# Patient Record
Sex: Male | Born: 1965 | Race: White | Hispanic: No | Smoking: Current every day smoker
Health system: Southern US, Community
[De-identification: ages and names within clinical notes are randomized; demographics above are authoritative.]

## PROBLEM LIST (undated history)

## (undated) DIAGNOSIS — T884XXA Failed or difficult intubation, initial encounter: Secondary | ICD-10-CM

## (undated) DIAGNOSIS — Z789 Other specified health status: Secondary | ICD-10-CM

## (undated) HISTORY — PX: VASECTOMY: SHX75

## (undated) HISTORY — PX: LEG SURGERY: SHX1003

## (undated) HISTORY — PX: HERNIA REPAIR: SHX51

## (undated) SURGERY — REPAIR, HERNIA, INGUINAL, LAPAROSCOPIC
Anesthesia: Choice | Laterality: Left

---

## 1998-09-27 HISTORY — PX: VASECTOMY: SHX75

## 2003-09-28 HISTORY — PX: INGUINAL HERNIA REPAIR: SUR1180

## 2005-04-05 ENCOUNTER — Ambulatory Visit: Payer: Self-pay | Admitting: Surgery

## 2009-05-14 ENCOUNTER — Ambulatory Visit: Payer: Self-pay | Admitting: Unknown Physician Specialty

## 2011-06-04 ENCOUNTER — Inpatient Hospital Stay: Payer: Self-pay | Admitting: Orthopedic Surgery

## 2011-06-04 DIAGNOSIS — I10 Essential (primary) hypertension: Secondary | ICD-10-CM

## 2013-01-17 IMAGING — CR DG TIBIA/FIBULA 2V*L*
1 series · 3 of 3 positions shown · non-contrast
Comparison: none

REASON FOR EXAM: fall injury
COMMENTS:

[Series 1: view not recorded · 0.17mm/px · 3 of 3 slices shown]
[im 1/3]
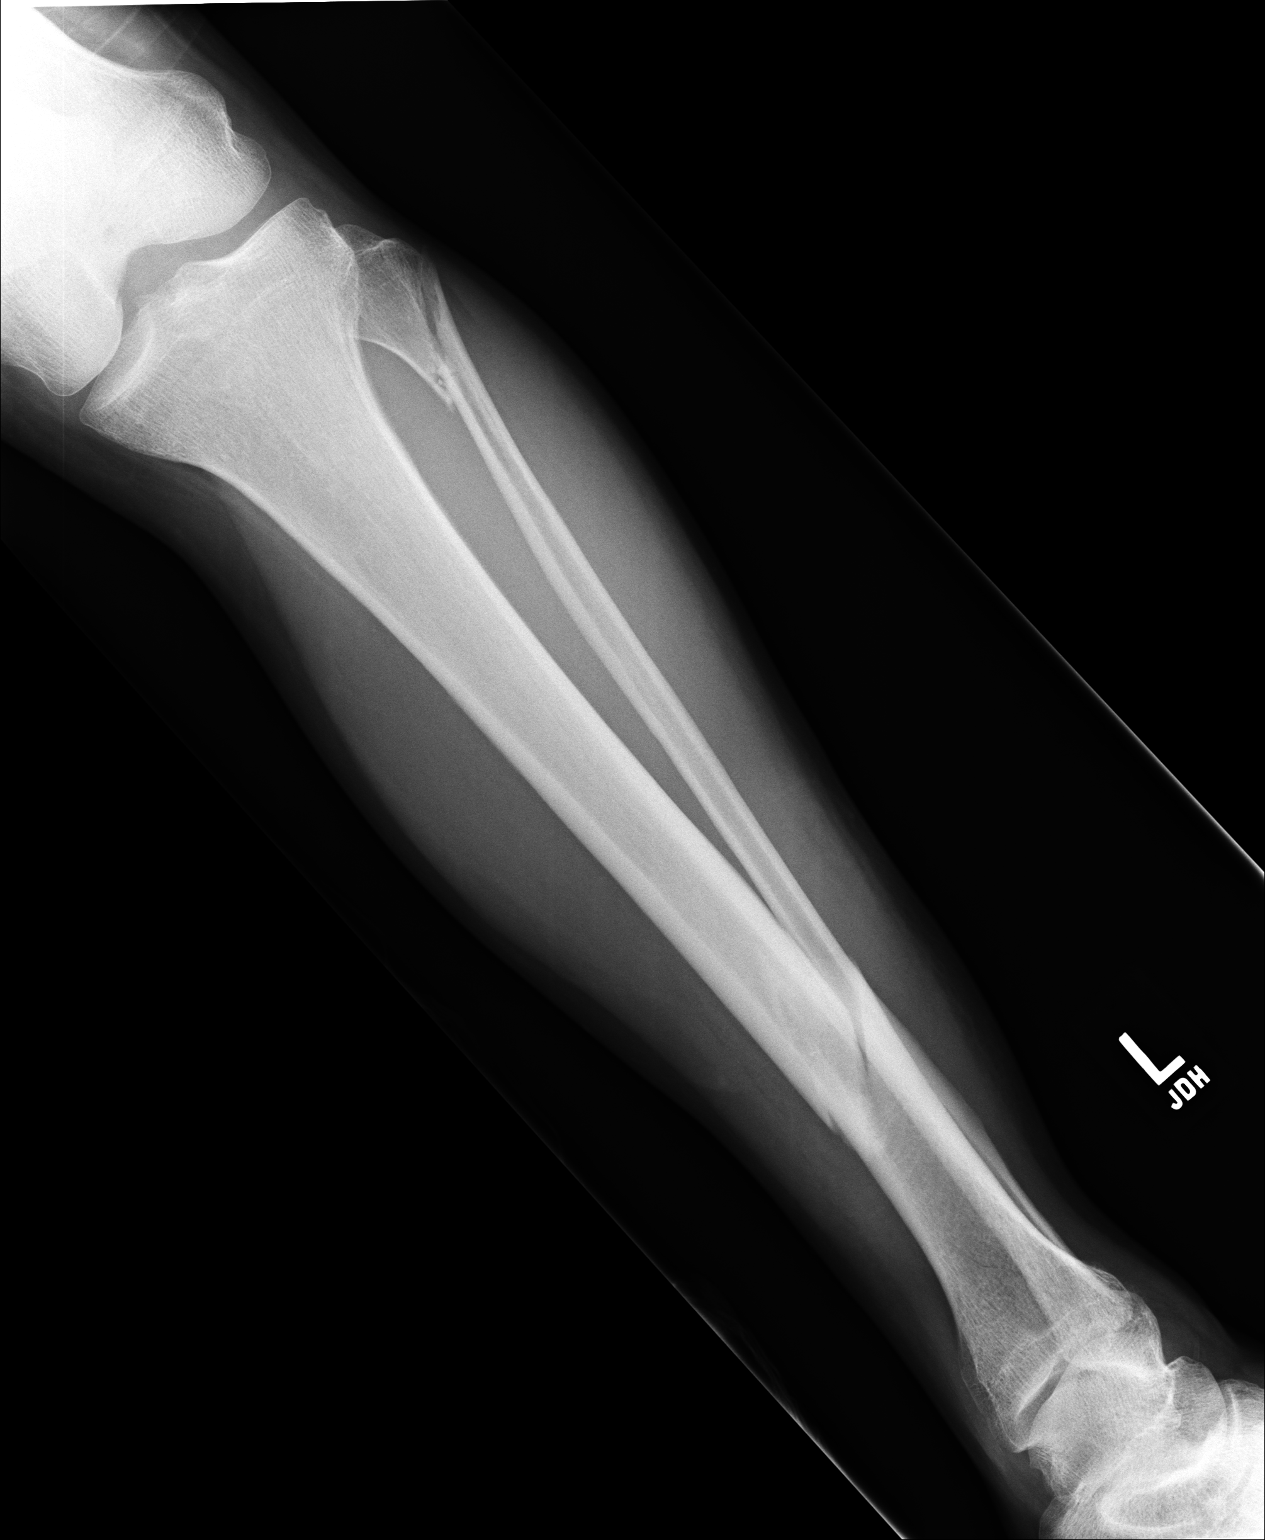
[im 2/3]
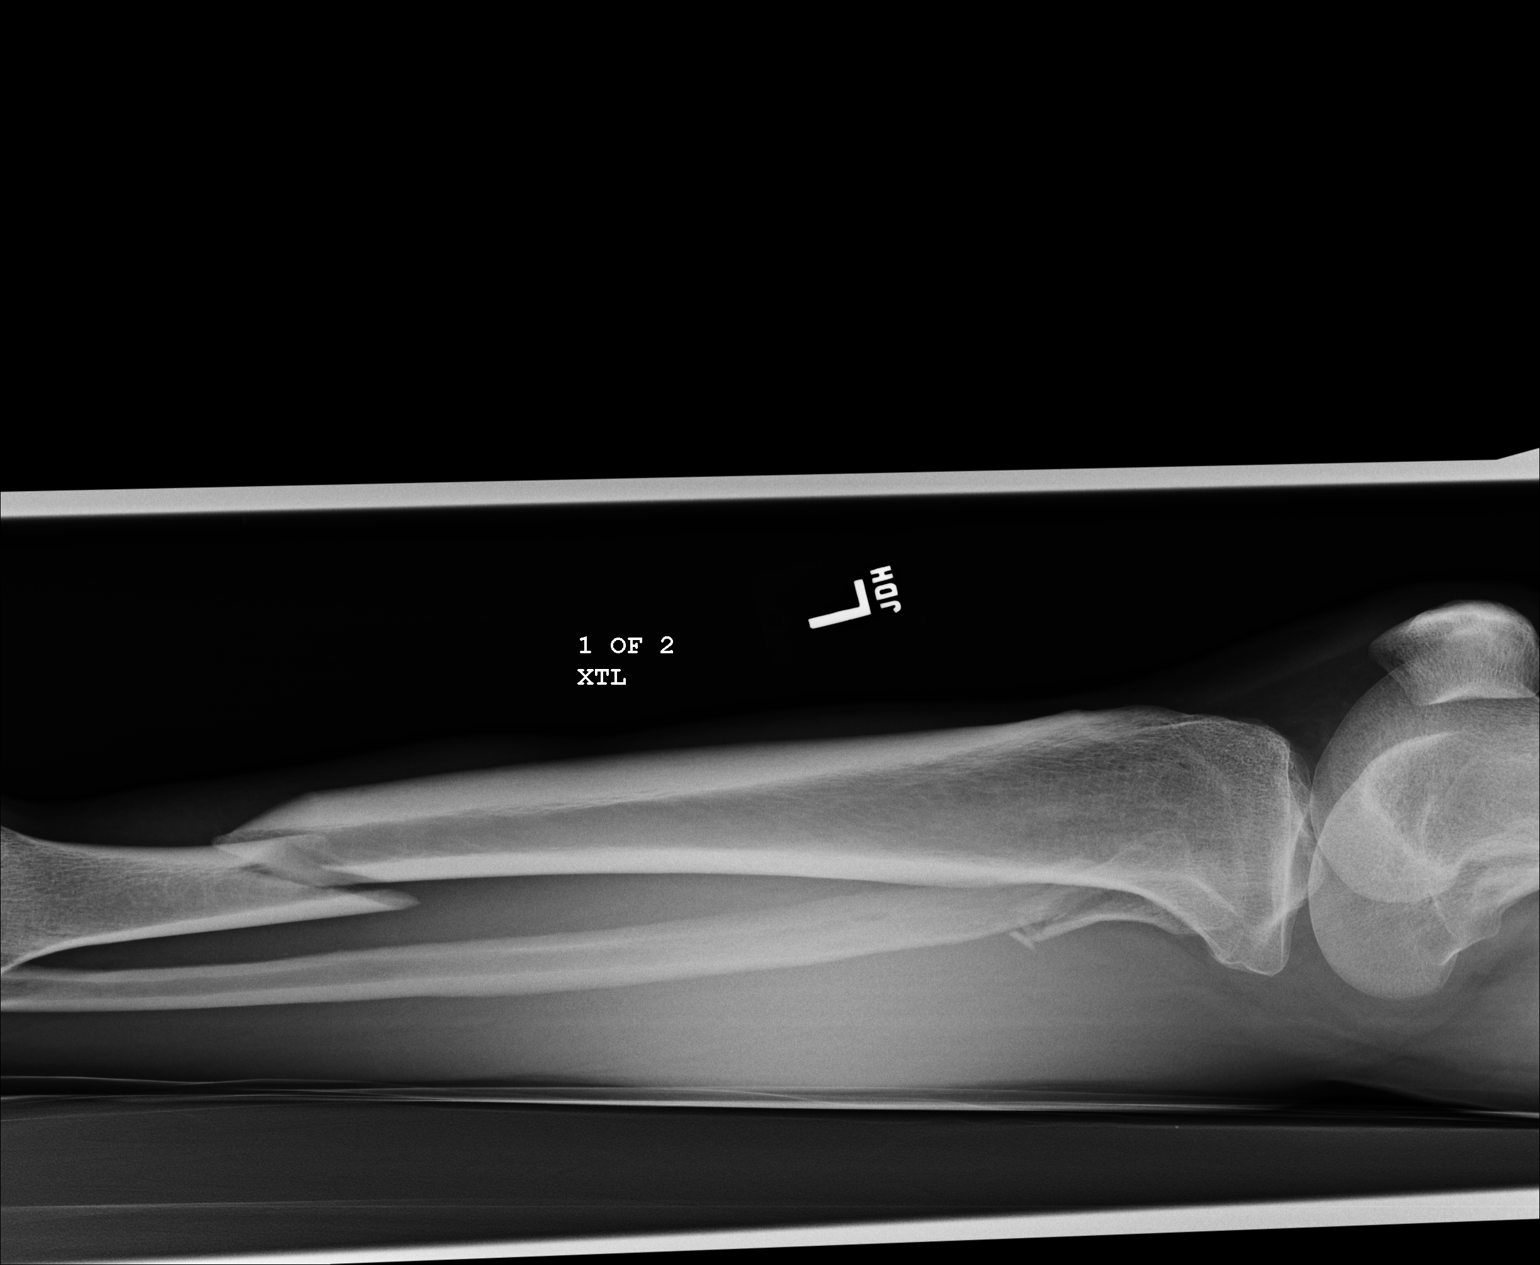
[im 3/3]
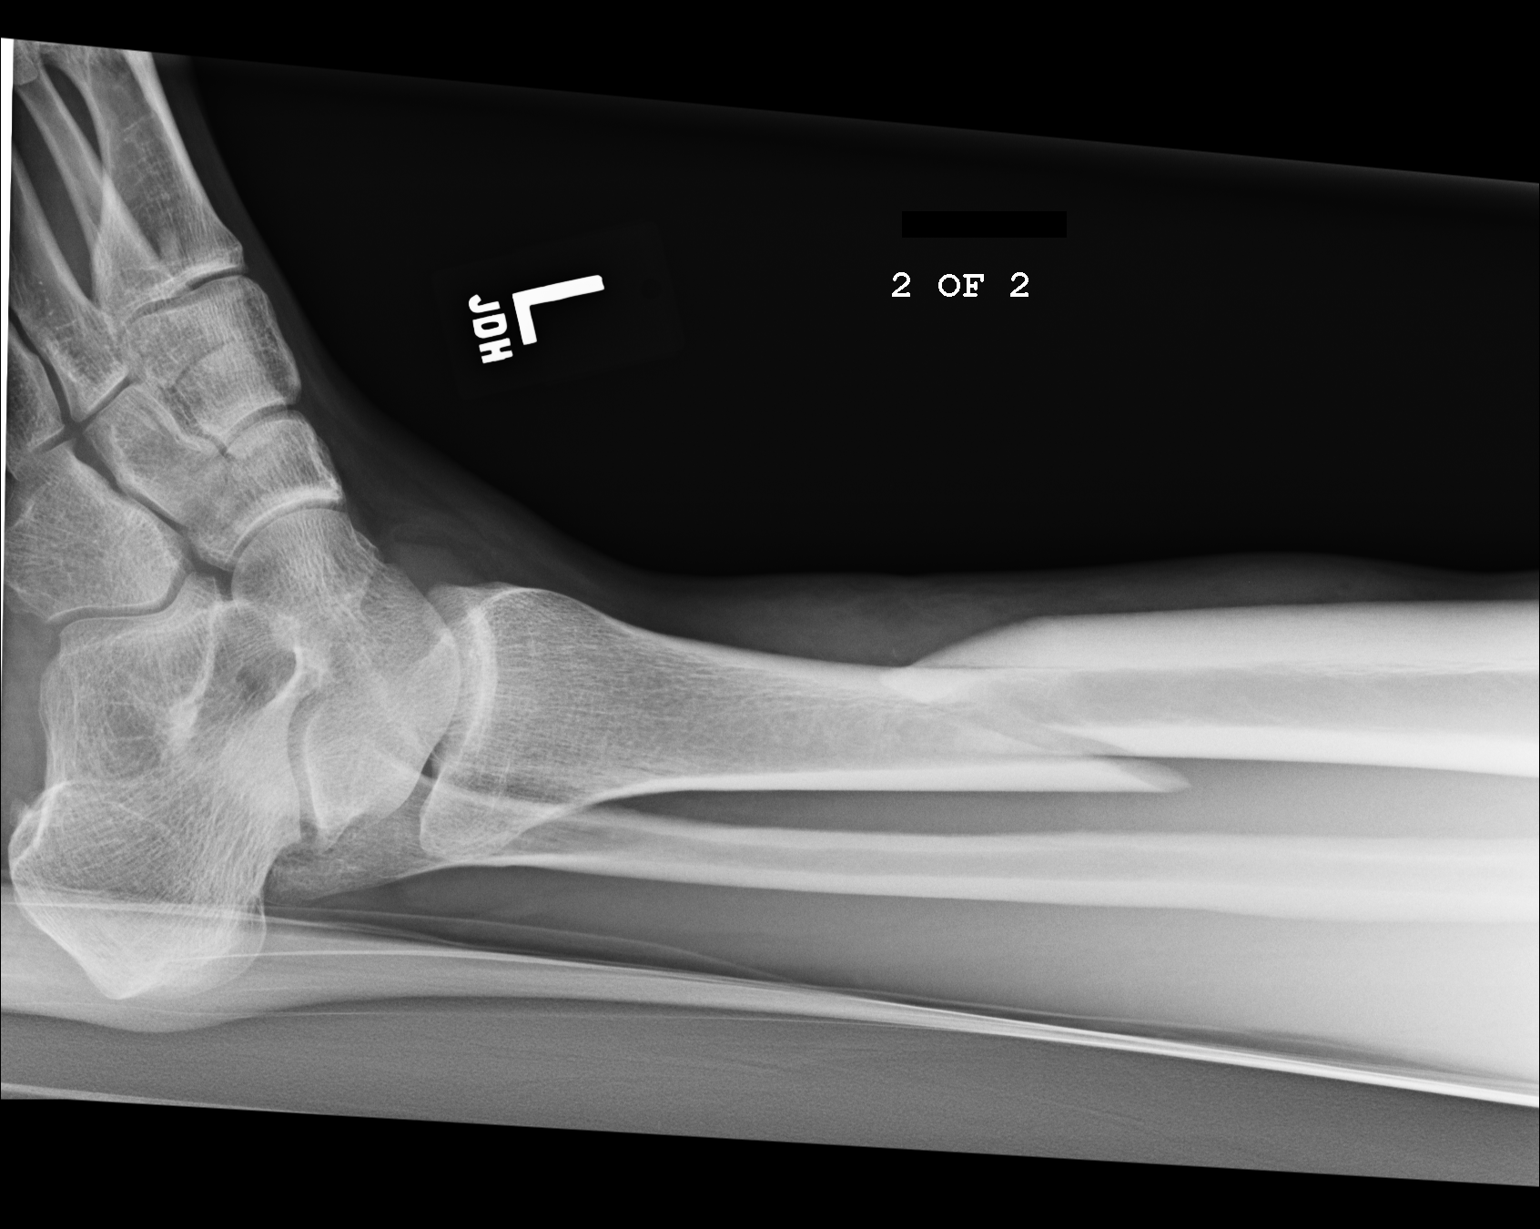

[3 of 3 positions shown; findings below may reference images not displayed]

PROCEDURE:     DXR - DXR TIBIA AND FIBULA LT (LOWER L  - June 04, 2011  [DATE]

RESULT:

A distal oblique tibial fracture is identified demonstrating posterior
displacement as well as posterior medial angulation. There is approximately
4 to 5 mm of distraction and 4 to 5 mm of override. An oblique fracture is
also identified along the proximal fibula with approximately 0.5 cm of
override, 3 to 4 cm of distraction and medial angulation.
IMPRESSION: Tibial and fibula fractures as described above.

## 2016-12-18 ENCOUNTER — Inpatient Hospital Stay
Admission: AD | Admit: 2016-12-18 | Discharge: 2016-12-20 | DRG: 885 | Disposition: A | Discharge status: Home / Self Care | Payer: No Typology Code available for payment source | Source: Ambulatory Visit | Attending: Psychiatry | Admitting: Psychiatry

## 2016-12-18 ENCOUNTER — Emergency Department
Admission: EM | Admit: 2016-12-18 | Discharge: 2016-12-18 | Disposition: A | Discharge status: Other Acute Inpatient Hospital | Payer: Self-pay | Attending: Emergency Medicine | Admitting: Emergency Medicine

## 2016-12-18 ENCOUNTER — Encounter: Payer: Self-pay | Admitting: Emergency Medicine

## 2016-12-18 DIAGNOSIS — F10220 Alcohol dependence with intoxication, uncomplicated: Secondary | ICD-10-CM | POA: Diagnosis present

## 2016-12-18 DIAGNOSIS — R45851 Suicidal ideations: Secondary | ICD-10-CM | POA: Diagnosis present

## 2016-12-18 DIAGNOSIS — F332 Major depressive disorder, recurrent severe without psychotic features: Principal | ICD-10-CM | POA: Diagnosis present

## 2016-12-18 DIAGNOSIS — F10920 Alcohol use, unspecified with intoxication, uncomplicated: Secondary | ICD-10-CM

## 2016-12-18 DIAGNOSIS — G4700 Insomnia, unspecified: Secondary | ICD-10-CM | POA: Diagnosis present

## 2016-12-18 DIAGNOSIS — F1010 Alcohol abuse, uncomplicated: Secondary | ICD-10-CM | POA: Diagnosis not present

## 2016-12-18 DIAGNOSIS — F17200 Nicotine dependence, unspecified, uncomplicated: Secondary | ICD-10-CM | POA: Insufficient documentation

## 2016-12-18 DIAGNOSIS — F10120 Alcohol abuse with intoxication, uncomplicated: Secondary | ICD-10-CM | POA: Insufficient documentation

## 2016-12-18 DIAGNOSIS — F1020 Alcohol dependence, uncomplicated: Secondary | ICD-10-CM | POA: Diagnosis present

## 2016-12-18 LAB — URINALYSIS, COMPLETE (UACMP) WITH MICROSCOPIC
BACTERIA UA: NONE SEEN
Bilirubin Urine: NEGATIVE
Glucose, UA: NEGATIVE mg/dL
Hgb urine dipstick: NEGATIVE
Ketones, ur: NEGATIVE mg/dL
LEUKOCYTES UA: NEGATIVE
NITRITE: NEGATIVE
PH: 7 (ref 5.0–8.0)
Protein, ur: NEGATIVE mg/dL
SPECIFIC GRAVITY, URINE: 1.021 (ref 1.005–1.030)
SQUAMOUS EPITHELIAL / LPF: NONE SEEN

## 2016-12-18 LAB — COMPREHENSIVE METABOLIC PANEL
ALBUMIN: 4.7 g/dL (ref 3.5–5.0)
ALT: 16 U/L — ABNORMAL LOW (ref 17–63)
AST: 24 U/L (ref 15–41)
Alkaline Phosphatase: 62 U/L (ref 38–126)
Anion gap: 7 (ref 5–15)
BUN: 12 mg/dL (ref 6–20)
CHLORIDE: 105 mmol/L (ref 101–111)
CO2: 28 mmol/L (ref 22–32)
CREATININE: 0.86 mg/dL (ref 0.61–1.24)
Calcium: 9.5 mg/dL (ref 8.9–10.3)
GFR calc non Af Amer: >60 mL/min (ref >60)
GLUCOSE: 109 mg/dL — AB (ref 65–99)
Potassium: 4.1 mmol/L (ref 3.5–5.1)
SODIUM: 140 mmol/L (ref 135–145)
Total Bilirubin: 0.6 mg/dL (ref 0.3–1.2)
Total Protein: 8.5 g/dL — ABNORMAL HIGH (ref 6.5–8.1)

## 2016-12-18 LAB — CBC WITH DIFFERENTIAL/PLATELET
Basophils Absolute: 0.1 10*3/uL (ref 0–0.1)
Basophils Relative: 1 %
Eosinophils Absolute: 0.1 10*3/uL (ref 0–0.7)
Eosinophils Relative: 2 %
HCT: 47.8 % (ref 40.0–52.0)
HEMOGLOBIN: 16.1 g/dL (ref 13.0–18.0)
Lymphocytes Relative: 32 %
Lymphs Abs: 2.4 10*3/uL (ref 1.0–3.6)
MCH: 28.9 pg (ref 26.0–34.0)
MCHC: 33.7 g/dL (ref 32.0–36.0)
MCV: 85.8 fL (ref 80.0–100.0)
MONOS PCT: 12 %
Monocytes Absolute: 0.9 10*3/uL (ref 0.2–1.0)
Neutro Abs: 4 10*3/uL (ref 1.4–6.5)
Neutrophils Relative %: 53 %
Platelets: 347 10*3/uL (ref 150–440)
RBC: 5.57 MIL/uL (ref 4.40–5.90)
RDW: 13.7 % (ref 11.5–14.5)
WBC: 7.5 10*3/uL (ref 3.8–10.6)

## 2016-12-18 LAB — URINE DRUG SCREEN, QUALITATIVE (ARMC ONLY)
Amphetamines, Ur Screen: NOT DETECTED
BARBITURATES, UR SCREEN: NOT DETECTED
Benzodiazepine, Ur Scrn: NOT DETECTED
COCAINE METABOLITE, UR ~~LOC~~: NOT DETECTED
Cannabinoid 50 Ng, Ur ~~LOC~~: NOT DETECTED
MDMA (ECSTASY) UR SCREEN: NOT DETECTED
METHADONE SCREEN, URINE: NOT DETECTED
OPIATE, UR SCREEN: NOT DETECTED
Phencyclidine (PCP) Ur S: NOT DETECTED
TRICYCLIC, UR SCREEN: NOT DETECTED

## 2016-12-18 LAB — SALICYLATE LEVEL

## 2016-12-18 LAB — ETHANOL: Alcohol, Ethyl (B): 99 mg/dL — ABNORMAL HIGH (ref <5)

## 2016-12-18 LAB — ACETAMINOPHEN LEVEL

## 2016-12-18 MED ORDER — ACETAMINOPHEN 325 MG PO TABS: 650.0000 mg | ORAL_TABLET | Freq: Four times a day (QID) | ORAL | Status: DC | PRN

## 2016-12-18 MED ORDER — ALUM & MAG HYDROXIDE-SIMETH 200-200-20 MG/5ML PO SUSP: 30.0000 mL | ORAL | Status: DC | PRN

## 2016-12-18 MED ORDER — TRAZODONE HCL 50 MG PO TABS: 50.0000 mg | ORAL_TABLET | Freq: Every evening | ORAL | Status: DC | PRN

## 2016-12-18 MED ORDER — HYDROXYZINE HCL 25 MG PO TABS: 25.0000 mg | ORAL_TABLET | Freq: Three times a day (TID) | ORAL | Status: DC | PRN

## 2016-12-18 MED ORDER — MAGNESIUM HYDROXIDE 400 MG/5ML PO SUSP: 30.0000 mL | Freq: Every day | ORAL | Status: DC | PRN

## 2016-12-18 NOTE — ED Provider Notes (Signed)
[facility] Emergency Department Provider Note   ____________________________________________   First MD Initiated Contact with Patient 12/18/16 [phone]     (approximate)  I have reviewed the triage vital signs and the nursing notes.   HISTORY  Chief Complaint Medical Clearance    HPI Corey Boyd is a 51 y.o. male who comes into the hospital today with suicidal ideation. He reports that him and his wife had an argument and the wife stated that he threatened to shoot himself. The patient was drinking and states he probably may have said something to get a response. He denies any suicidal ideation. He denies any previous suicidal ideation although the IVC paperwork states that he has done this in the past. The patient states he had a bottle of wine another couple of glasses. He denies any drugs and denies that he has ever been hospitalized in a psychiatric facility. The patient has no complaints at this time. The patient denies any feelings of depression or any symptoms of hallucinations.   History reviewed. No pertinent past medical history.  There are no active problems to display for this patient.   Past Surgical History:  Procedure Laterality Date  . HERNIA REPAIR    . LEG SURGERY    . VASECTOMY      Prior to Admission medications   Not on File    Allergies Patient has no known allergies.  History reviewed. No pertinent family history.  Social History Social History  Substance Use Topics  . Smoking status: Current Every Day Smoker    Packs/day: 1.00  . Smokeless tobacco: Never Used  . Alcohol use Yes     Comment: occasionally    Review of Systems Constitutional: No fever/chills Eyes: No visual changes. ENT: No sore throat. Cardiovascular: Denies chest pain. Respiratory: Denies shortness of breath. Gastrointestinal: No abdominal pain.  No nausea, no vomiting.  No diarrhea.  No constipation. Genitourinary: Negative for  dysuria. Musculoskeletal: Negative for back pain. Skin: Negative for rash. Neurological: Negative for headaches, focal weakness or numbness. Psych: Suicidal gestures  10-point ROS otherwise negative.  ____________________________________________   PHYSICAL EXAM:  VITAL SIGNS: ED Triage Vitals  Enc Vitals Group     BP 12/18/16 0111 (!) 156/92     Pulse Rate 12/18/16 0111 (!) 118     Resp 12/18/16 0111 18     Temp 12/18/16 0111 98.4 F (36.9 C)     Temp Source 12/18/16 0111 Oral     SpO2 12/18/16 0111 99 %     Weight 12/18/16 0111 190 lb (86.2 kg)     Height 12/18/16 0111 6' (1.829 m)     Head Circumference --      Peak Flow --      Pain Score 12/18/16 0112 0     Pain Loc --      Pain Edu? --      Excl. in GC? --     Constitutional: Alert and oriented. Well appearing and in no acute distress. Eyes: Conjunctivae are normal. PERRL. EOMI. Head: Atraumatic. Nose: No congestion/rhinnorhea. Mouth/Throat: Mucous membranes are moist.  Oropharynx non-erythematous. Cardiovascular: Normal rate, regular rhythm. Grossly normal heart sounds.  Good peripheral circulation. Respiratory: Normal respiratory effort.  No retractions. Lungs CTAB. Gastrointestinal: Soft and nontender. No distention. Positive bowel sounds Musculoskeletal: No lower extremity tenderness nor edema.  Neurologic:  Normal speech and language. none Skin:  Skin is warm, dry and intact.  Psychiatric: Mood and affect are normal.  ____________________________________________   LABS (all labs ordered are listed, but only abnormal results are displayed)  Labs Reviewed  COMPREHENSIVE METABOLIC PANEL - Abnormal; Notable for the following:       Result Value   Glucose, Bld 109 (*)    Total Protein 8.5 (*)    ALT 16 (*)    All other components within normal limits  ETHANOL - Abnormal; Notable for the following:    Alcohol, Ethyl (B) 99 (*)    All other components within normal limits  ACETAMINOPHEN LEVEL -  Abnormal; Notable for the following:    Acetaminophen (Tylenol), Serum <10 (*)    All other components within normal limits  CBC WITH DIFFERENTIAL/PLATELET  SALICYLATE LEVEL  URINE DRUG SCREEN, QUALITATIVE ([facility] ONLY)   ____________________________________________  EKG  none ____________________________________________  RADIOLOGY  none ____________________________________________   PROCEDURES  Procedure(s) performed: None  Procedures  Critical Care performed: No  ____________________________________________   INITIAL IMPRESSION / ASSESSMENT AND PLAN / ED COURSE  Pertinent labs & imaging results that were available during my care of the patient were reviewed by me and considered in my medical decision making (see chart for details).  This is a 51 year old male who comes into the hospital today with suicidal gestures. The patient denies any suicidal ideation but according to the IVC paperwork he was holding a gun to his chin and threatening to shoot himself. I will have the patient seen by TTS as well as tele-psychiatry. The patient will be seen by psych.      ____________________________________________   FINAL CLINICAL IMPRESSION(S) / ED DIAGNOSES  Final diagnoses:  Suicidal ideation  Alcoholic intoxication without complication (HCC)      NEW MEDICATIONS STARTED DURING THIS VISIT:  New Prescriptions   No medications on file     Note:  This document was prepared using Dragon voice recognition software and may include unintentional dictation errors.    Rebecka ApleyAllison P Svea Pusch, MD 12/18/16 [phone]

## 2016-12-18 NOTE — ED Notes (Signed)
[facility] Dr. Toni Amendlapacs.  Attending Physician will be Dr. Jennet MaduroPucilowska.   Patient [facility] Gwen.   Intake Paper Work has been signed and placed on patient chart.  ER staff is aware of the admission ( ER Sect.; Dr. Alphonzo LemmingsMcShane, ER MD; Clydie BraunKaren Patient's Nurse & Nedra HaiLee Patient Access).
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
## 2016-12-18 NOTE — ED Notes (Signed)
Pt informed will be staying and not going home today. Pt unhappy about this but remains calm and cooperative at this time. Wants a phone call; informed him will bring phone at 100 pm during phone hours. Verbalized understanding.

## 2016-12-18 NOTE — ED Notes (Signed)
Waiting on disposition

## 2016-12-18 NOTE — ED Notes (Signed)
SOC has recommended inpatient admission

## 2016-12-18 NOTE — ED Notes (Signed)
Referral information for Placement has been faxed to;    [facility]  [phone])   [facility] ([phone]),    [facility] [phone]),    [facility] [phone]),

## 2016-12-18 NOTE — ED Notes (Signed)
Refused food tray. 

## 2016-12-18 NOTE — ED Triage Notes (Signed)
[facility] [place] [facility] [place]. Pt states he has been drinking tonight and got into an argument with his wife and "said some stupid things". Officer reports IVC papers are on the way.
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
## 2016-12-18 NOTE — ED Notes (Signed)
Pt being reviewed for possible admission to [facility]. H&P and Assessment have been faxed to the BH Unit for the charge nurse to review and provide bed assignment.      Nicole Yul Diana, MS, NCC, LPCA Therapeutic Triage Specialist  

## 2016-12-18 NOTE — ED Notes (Signed)
SOC taken to room att

## 2016-12-18 NOTE — ED Notes (Signed)
[facility] Will be evaluating pt shortly.
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
## 2016-12-18 NOTE — ED Notes (Signed)
Pt oriented to BHU 5. He denies SI and says his actions were "stupid" and he regrets them. He is calm and cooperative but says he wishes he could go home. He denies pain. He says he does not drink daily. Regarding the night in question, he admits drinking a bottle of wine plus two more glasses. He has made a phone call and used the TV remote. Will continue to monitor for needs/safety.

## 2016-12-18 NOTE — Progress Notes (Signed)
Pt was transferred from BHU to BMU, stable, AOx4, and in no acute distress. Belongings and paperwork were handed to pt's RN.

## 2016-12-18 NOTE — BH Assessment (Signed)
Assessment Note  Corey Boyd is an 51 y.o. male presenting to the ED for suicidal ideations and alcohol intoxication.  According to the IVC, pt placed a gun to his chin and said he was going to blow his brains out.  Patient made these threat sin front of his girlfriend and grandchildren.    Patient confirms the allegations but denies intent.  He reports that he and girlfriend were arguing and stated that he made the suicidal gesture out anger.  He reports he and girlfriend were drinking last night and got into a heated argument.  He states that they have a "love/hate relationship" with each other.  He states that he made a "stupid mistake" in making the suicidal gesture.  Pt currently denies SI/HI.  He denies any drug/alcohol use, although he did state that he drinks a couple off beer one occasions.  He denies any auditory/visual hallucinations.  Diagnosis: Suicidal secondary to alcohol intoxication  Past Medical History: History reviewed. No pertinent past medical history.  Past Surgical History:  Procedure Laterality Date  . HERNIA REPAIR    . LEG SURGERY    . VASECTOMY      Family History: History reviewed. No pertinent family history.  Social History:  reports that he has been smoking.  He has been smoking about 1.00 pack per day. He has never used smokeless tobacco. He reports that he drinks alcohol. His drug history is not on file.  Additional Social History:  Alcohol / Drug Use Pain Medications: See PTA Prescriptions: See PTA Over the Counter: See PTA History of alcohol / drug use?: No history of alcohol / drug abuse (Pt denies history of drug/alcohol use)  CIWA: CIWA-Ar BP: 122/84 Pulse Rate: 95 COWS:    Allergies: No Known Allergies  Home Medications:  (Not in a hospital admission)  OB/GYN Status:  No LMP for male patient.  General Assessment Data Location of Assessment: [facility] ED TTS Assessment: In system Is this a Tele or Face-to-Face Assessment?:  Face-to-Face Is this an Initial Assessment or a Re-assessment for this encounter?: Initial Assessment Marital status: Married [place] name: n/a Is patient pregnant?: No Pregnancy Status: No Living Arrangements: Spouse/significant other Can pt return to current living arrangement?: Yes Admission Status: Involuntary Is patient capable of signing voluntary admission?: Yes Referral Source: Self/Family/Friend Insurance type: None     Crisis Care Plan Living Arrangements: Spouse/significant other Legal Guardian: Other: (self) Name of Psychiatrist: None reported Name of Therapist: none reported  Education Status Is patient currently in school?: No Current Grade: na Highest grade of school patient has completed: na Name of school: na Contact person: na  Risk to self with the past 6 months Suicidal Ideation: Yes-Currently Present Has patient been a risk to self within the past 6 months prior to admission? : No Suicidal Intent: No Has patient had any suicidal intent within the past 6 months prior to admission? : No Is patient at risk for suicide?: No Suicidal Plan?: No Has patient had any suicidal plan within the past 6 months prior to admission? : No Access to Means: Yes Specify Access to Suicidal Means: Pt has access to a gun What has been your use of drugs/alcohol within the last 12 months?: Pt reports drinking several beers last night Previous Attempts/Gestures: No How many times?: 0 Other Self Harm Risks: None identified Triggers for Past Attempts: None known Intentional Self Injurious Behavior: None Family Suicide History: No Recent stressful life event(s): Conflict (Comment) (conflict with wife) Persecutory voices/beliefs?:  No Depression: Yes Depression Symptoms: Loss of interest in usual pleasures Substance abuse history and/or treatment for substance abuse?: No Suicide prevention information given to non-admitted patients: Not applicable  Risk to Others within the  past 6 months Homicidal Ideation: No Does patient have any lifetime risk of violence toward others beyond the six months prior to admission? : No Thoughts of Harm to Others: No Current Homicidal Intent: No Current Homicidal Plan: No Access to Homicidal Means: No Identified Victim: None identified History of harm to others?: No Assessment of Violence: None Noted Violent Behavior Description: None identified Does patient have access to weapons?: Yes (Comment) Criminal Charges Pending?: No Does patient have a court date: No Is patient on probation?: No  Psychosis Hallucinations: None noted Delusions: None noted  Mental Status Report Appearance/Hygiene: In scrubs Eye Contact: Good Motor Activity: Freedom of movement Speech: Logical/coherent Level of Consciousness: Drowsy Mood: Ashamed/humiliated, Pleasant Affect: Appropriate to circumstance Anxiety Level: Minimal Thought Processes: Relevant Judgement: Impaired Orientation: Person, Place, Time, Situation Obsessive Compulsive Thoughts/Behaviors: Minimal  Cognitive Functioning Concentration: Normal Memory: Recent Intact, Remote Intact IQ: Average Insight: Poor Impulse Control: Poor Appetite: Fair Weight Loss: 0 Weight Gain: 0 Sleep: No Change Vegetative Symptoms: None  ADLScreening [facility] Assessment Services) Patient's cognitive ability adequate to safely complete daily activities?: Yes Patient able to express need for assistance with ADLs?: Yes Independently performs ADLs?: Yes (appropriate for developmental age)  Prior Inpatient Therapy Prior Inpatient Therapy: No Prior Therapy Dates: na Prior Therapy Facilty/Provider(s): na Reason for Treatment: na  Prior Outpatient Therapy Prior Outpatient Therapy: No Prior Therapy Dates: na Prior Therapy Facilty/Provider(s): na Reason for Treatment: na Does patient have an ACCT team?: No Does patient have Intensive In-House Services?  : No Does patient have [facility]  services? : No Does patient have P4CC services?: No  ADL Screening (condition at time of admission) Patient's cognitive ability adequate to safely complete daily activities?: Yes Patient able to express need for assistance with ADLs?: Yes Independently performs ADLs?: Yes (appropriate for developmental age)       Abuse/Neglect Assessment (Assessment to be complete while patient is alone) Physical Abuse: Denies Verbal Abuse: Denies Sexual Abuse: Denies Exploitation of patient/patient's resources: Denies Self-Neglect: Denies Values / Beliefs Cultural Requests During Hospitalization: None Spiritual Requests During Hospitalization: None Consults Spiritual Care Consult Needed: No Social Work Consult Needed: No Merchant navy officerAdvance Directives (For Healthcare) Does Patient Have a Medical Advance Directive?: No    Additional Information 1:1 In Past 12 Months?: No CIRT Risk: No Elopement Risk: No Does patient have medical clearance?: Yes     Disposition:  Disposition Initial Assessment Completed for this Encounter: Yes Disposition of Patient: Other dispositions Other disposition(s): Other (Comment) (Pending SOC conflict)  On Site Evaluation by:   Reviewed with Physician:    Artist Beachoxana C Holley Wirt 12/18/2016 6:39 AM

## 2016-12-18 NOTE — ED Notes (Signed)
NAD. Respirations unlabored, remains asleep currently.

## 2016-12-18 NOTE — Tx Team (Signed)
Initial Treatment Plan 12/18/2016 4:30 PM Corey Boyd ZOX:[id number]    PATIENT STRESSORS: Marital or family conflict Substance abuse   PATIENT STRENGTHS: Ability for insight Average or above average intelligence Capable of independent living Financial means Physical Health Work skills   PATIENT IDENTIFIED PROBLEMS:   "I held a gun to my head"-denies SI    Drinks alcohol "on weekends"                DISCHARGE CRITERIA:  Ability to meet basic life and health needs Improved stabilization in mood, thinking, and/or behavior Need for constant or close observation no longer present Safe-care adequate arrangements made Verbal commitment to aftercare and medication compliance  PRELIMINARY DISCHARGE PLAN: Outpatient therapy Return to previous living arrangement Return to previous work or school arrangements  PATIENT/FAMILY INVOLVEMENT: This treatment plan has been presented to and reviewed with the patient, Corey Boyd.  The patient and family have been given the opportunity to ask questions and make suggestions.  Tonye PearsonAmanda N Keena Dinse, RN 12/18/2016, 4:30 PM

## 2016-12-19 ENCOUNTER — Encounter: Payer: Self-pay | Admitting: Psychiatry

## 2016-12-19 DIAGNOSIS — F17200 Nicotine dependence, unspecified, uncomplicated: Secondary | ICD-10-CM | POA: Diagnosis present

## 2016-12-19 DIAGNOSIS — F1020 Alcohol dependence, uncomplicated: Secondary | ICD-10-CM | POA: Diagnosis present

## 2016-12-19 DIAGNOSIS — F1010 Alcohol abuse, uncomplicated: Secondary | ICD-10-CM

## 2016-12-19 LAB — TSH: TSH: 4.526 u[IU]/mL — ABNORMAL HIGH (ref 0.350–4.500)

## 2016-12-19 NOTE — [facility] Suicide Risk Assessment (Signed)
[facility] INPATIENT:  Family/Significant Other Suicide Prevention Education  Suicide Prevention Education:  Contact Attempts:Jill Clagett(wife [phone]), has been identified by the patient as the family member/significant other with whom the patient will be residing, and identified as the person(s) who will aid the patient in the event of a mental health crisis.  With written consent from the patient, two attempts were made to provide suicide prevention education, prior to and/or following the patient's discharge.  We were unsuccessful in providing suicide prevention education.  A suicide education pamphlet was given to the patient to share with family/significant other.  Date and time of first attempt: 12/19/2016 / 5:01pm; No answer, CSW  left HIPAA compliant voicemail requesting returned call.   Clorinda Wyble G. Garnette CzechSampson MSW, LCSWA 12/19/2016, 5:48 PM

## 2016-12-19 NOTE — [facility] Group Notes (Signed)
[facility] LCSW Group Therapy  12/19/2016 2:46 PM  Type of Therapy:  Group Therapy  Participation Level:  Minimal  Participation Quality:  Attentive  Affect:  Appropriate  Cognitive:  Alert  Insight:  None  Engagement in Therapy:  Lacking  Modes of Intervention:  Activity, Discussion, Education, Problem-solving, Reality Testing, Socialization and Support  Summary of Progress/Problems: Stress management: Patients defined and discussed the topic of stress and the related symptoms and triggers for stress. Patients identified healthy coping skills they would like to try during hospitalization and after discharge to manage stress in a healthy way. CSW offered insight to varying stress management techniques. Patient participated in the mindfulness mediation and was able to identify areas of stress which includes financial stress. Patient discuss how stress effects him emotionally causing irritability or depression. CSW provided support and discuss how mindfulness mediation could be utilized as a Associate Professorcoping skill.   Corey Boyd 12/19/2016, 2:49 PM

## 2016-12-19 NOTE — H&P (Signed)
Psychiatric Admission Assessment Adult  Patient Identification: Corey Boyd MRN:  [id number] Date of Evaluation:  12/19/2016 Chief Complaint:  major depression disorder Principal Diagnosis: <principal problem not specified> Diagnosis:   Patient Active Problem List   Diagnosis Date Noted  . Severe recurrent major depression (HCC) [F33.2] 12/18/2016   History of Present Illness:   c/c- "I did something stupid"  Corey Boyd is an 51 y.o. male with no prior psych hx, admitted from ED. Pt presenting to the ED for suicidal ideations and alcohol intoxication.  According to the IVC, pt placed a gun to his chin and said he was going to blow his brains out.  Patient made these threat in front of his girlfriend and grandchildren.  Per report, Pt  had made similar threats in the past also.  Last night( 3/23) he did this in the company of a 26-year-old child, child protective services involved. Per report, wife  feels that she is unsafe for him as he is pointed the gun at her when he is intoxicated and he is jealous and irrational.  the patient hid the gun somewhere and no one knows exactly where it is. BAL-99, UDS- negative. The presents as calm,  somewhat guarded, states "I did something stupid", states he  had a bottle of wine and another couple of glasses, and was intoxicated but has memory of all events. denies suicidal intent.  He reports that he and girlfriend were drinking and arguing  and he made the suicidal gesture out anger and intoxication.   Denies significant relationship problem( may be minimizing).    he wishes he could go home. States he drinks about a bottle of wine on some weekends, denies daily drinking. States his wife drinks almost daily , but functional.   Pt currently denies SI/HI.   He denies any auditory/visual hallucinations.  Associated Signs/Symptoms: Depression Symptoms:  denies (Hypo) Manic Symptoms:  Impulsivity, Anxiety Symptoms:  anxiety Psychotic Symptoms:   denies PTSD Symptoms: Negative Total Time spent with patient: 1 hour  Past Psychiatric History: no psych hospitalization, no psych diagnosis, tx  Is the patient at risk to self? Yes.    Has the patient been a risk to self in the past 6 months? Yes.    Has the patient been a risk to self within the distant past? yes Is the patient a risk to others? No.  Has the patient been a risk to others in the past 6 months? No.  Has the patient been a risk to others within the distant past? No.   Prior Inpatient Therapy:   Prior Outpatient Therapy:    Alcohol Screening: 1. How often do you have a drink containing alcohol?: 2 to 4 times a month 2. How many drinks containing alcohol do you have on a typical day when you are drinking?: 5 or 6 3. How often do you have six or more drinks on one occasion?: Weekly Preliminary Score: 5 4. How often during the last year have you found that you were not able to stop drinking once you had started?: Never 5. How often during the last year have you failed to do what was normally expected from you becasue of drinking?: Less than monthly 6. How often during the last year have you needed a first drink in the morning to get yourself going after a heavy drinking session?: Never 7. How often during the last year have you had a feeling of guilt of remorse after drinking?: Less than monthly  8. How often during the last year have you been unable to remember what happened the night before because you had been drinking?: Never 9. Have you or someone else been injured as a result of your drinking?: No 10. Has a relative or friend or a doctor or another health worker been concerned about your drinking or suggested you cut down?: Yes, during the last year Alcohol Use Disorder Identification Test Final Score (AUDIT): 13 Brief Intervention: Yes Substance Abuse History in the last 12 months:  Yes.   Consequences of Substance Abuse: Family Consequences:  arguments with  wife Previous Psychotropic Medications: No  Psychological Evaluations: No  Past Medical History: History reviewed. No pertinent past medical history.  Past Surgical History:  Procedure Laterality Date  . HERNIA REPAIR    . LEG SURGERY    . VASECTOMY     Family History: History reviewed. No pertinent family history. Family Psychiatric  History: denies Tobacco Screening: Have you used any form of tobacco in the last 30 days? (Cigarettes, Smokeless Tobacco, Cigars, and/or Pipes): Yes Tobacco use, Select all that apply: 5 or more cigarettes per day Are you interested in Tobacco Cessation Medications?: No, patient refused Counseled patient on smoking cessation including recognizing danger situations, developing coping skills and basic information about quitting provided: Refused/Declined practical counseling  He has been smoking about 1.00 pack per day. Social History:  History  Alcohol Use  . Yes    Comment: occasionally, weekends     History  Drug Use No    Comment: denies     Additional Social History:                           Allergies:  No Known Allergies Lab Results:  Results for orders placed or performed during the hospital encounter of 12/18/16 (from the past 48 hour(s))  Urine Drug Screen, Qualitative ([facility] only)     Status: None   Collection Time: 12/18/16  1:19 AM  Result Value Ref Range   Tricyclic, Ur Screen NONE DETECTED NONE DETECTED   Amphetamines, Ur Screen NONE DETECTED NONE DETECTED   MDMA (Ecstasy)Ur Screen NONE DETECTED NONE DETECTED   Cocaine Metabolite,Ur [place] NONE DETECTED NONE DETECTED   Opiate, Ur Screen NONE DETECTED NONE DETECTED   Phencyclidine (PCP) Ur S NONE DETECTED NONE DETECTED   Cannabinoid 50 Ng, Ur [place] NONE DETECTED NONE DETECTED   Barbiturates, Ur Screen NONE DETECTED NONE DETECTED   Benzodiazepine, Ur Scrn NONE DETECTED NONE DETECTED   Methadone Scn, Ur NONE DETECTED NONE DETECTED    Comment: (NOTE) 100  Tricyclics, urine                Cutoff 1000 ng/mL 200  Amphetamines, urine             Cutoff 1000 ng/mL 300  MDMA (Ecstasy), urine           Cutoff 500 ng/mL 400  Cocaine Metabolite, urine       Cutoff 300 ng/mL 500  Opiate, urine                   Cutoff 300 ng/mL 600  Phencyclidine (PCP), urine      Cutoff 25 ng/mL 700  Cannabinoid, urine              Cutoff 50 ng/mL 800  Barbiturates, urine             Cutoff 200 ng/mL 900  Benzodiazepine,  urine           Cutoff 200 ng/mL 1000 Methadone, urine                Cutoff 300 ng/mL 1100 1200 The urine drug screen provides only a preliminary, unconfirmed 1300 analytical test result and should not be used for non-medical 1400 purposes. Clinical consideration and professional judgment should 1500 be applied to any positive drug screen result due to possible 1600 interfering substances. A more specific alternate chemical method 1700 must be used in order to obtain a confirmed analytical result.  1800 Gas chromato graphy / mass spectrometry (GC/MS) is the preferred 1900 confirmatory method.   TSH     Status: Abnormal   Collection Time: 12/18/16  1:19 AM  Result Value Ref Range   TSH 4.526 (H) 0.350 - 4.500 uIU/mL    Comment: Performed by a 3rd Generation assay with a functional sensitivity of <=0.01 uIU/mL.  Urinalysis, Complete w Microscopic     Status: Abnormal   Collection Time: 12/18/16  4:39 PM  Result Value Ref Range   Color, Urine YELLOW (A) YELLOW   APPearance HAZY (A) CLEAR   Specific Gravity, Urine 1.021 1.005 - 1.030   pH 7.0 5.0 - 8.0   Glucose, UA NEGATIVE NEGATIVE mg/dL   Hgb urine dipstick NEGATIVE NEGATIVE   Bilirubin Urine NEGATIVE NEGATIVE   Ketones, ur NEGATIVE NEGATIVE mg/dL   Protein, ur NEGATIVE NEGATIVE mg/dL   Nitrite NEGATIVE NEGATIVE   Leukocytes, UA NEGATIVE NEGATIVE   RBC / HPF 0-5 0 - 5 RBC/hpf   WBC, UA 0-5 0 - 5 WBC/hpf   Bacteria, UA NONE SEEN NONE SEEN   Squamous Epithelial / LPF NONE SEEN NONE SEEN   Mucous PRESENT      Blood Alcohol level:  Lab Results  Component Value Date   ETH 99 (H) 12/18/2016    Metabolic Disorder Labs:  No results found for: HGBA1C, MPG No results found for: PROLACTIN No results found for: CHOL, TRIG, HDL, CHOLHDL, VLDL, LDLCALC  Current Medications: Current Facility-Administered Medications  Medication Dose Route Frequency Provider Last Rate Last Dose  . acetaminophen (TYLENOL) tablet 650 mg  650 mg Oral Q6H PRN Beverly Sessions, MD      . alum & mag hydroxide-simeth (MAALOX/MYLANTA) 200-200-20 MG/5ML suspension 30 mL  30 mL Oral Q4H PRN Beverly Sessions, MD      . hydrOXYzine (ATARAX/VISTARIL) tablet 25 mg  25 mg Oral TID PRN Beverly Sessions, MD      . magnesium hydroxide (MILK OF MAGNESIA) suspension 30 mL  30 mL Oral Daily PRN Beverly Sessions, MD      . traZODone (DESYREL) tablet 50 mg  50 mg Oral QHS PRN Beverly Sessions, MD       PTA Medications: No prescriptions prior to admission.    Musculoskeletal: Strength & Muscle Tone: within normal limits Gait & Station: normal Patient leans:   Psychiatric Specialty Exam: Physical Exam  Nursing note and vitals reviewed.   ROS  Blood pressure 134/80, pulse 69, temperature 98.4 F (36.9 C), temperature source Oral, resp. rate 18, height 6' (1.829 m), weight 83 kg (183 lb), SpO2 100 %.Body mass index is 24.82 kg/m.  General Appearance: Negative  Eye Contact:  Fair  Speech:  Normal Rate  Volume:  Normal  Mood:  Anxious  Affect:  Appropriate  Thought Process:  Goal Directed  Orientation:  Full (Time, Place, and Person)  Thought Content:  Guilt about the incident  Suicidal Thoughts:  No  Homicidal Thoughts:  No  Memory:  intact  Judgement:  Impaired  Insight:  Shallow  Psychomotor Activity:  Normal  Concentration:  fair  Recall:  Good  Fund of Knowledge:  Good  Language:  Good  Akathisia:  No  Handed:    AIMS (if indicated):     Assets:  Communication Skills Desire for Improvement Housing Social  Support  ADL's:  Intact  Cognition:  WNL  Sleep:       Treatment Plan Summary: Daily contact with patient to assess and evaluate symptoms and progress in treatment and Medication management  Observation Level/Precautions:  [place]op[place] 15 minute checks  Laboratory:  CBC Chemistry Profile HbAIC UDS UA  Psychotherapy:    Medications:    Consultations:    Discharge Concerns:    Estimated LOS:  Other:     Physician Treatment Plan for Primary Diagnosis: <principal problem not specified> Long Term Goal(s): Improvement in symptoms so as ready for discharge  Short Term Goals: Ability to identify changes in lifestyle to reduce recurrence of condition will improve, Ability to verbalize feelings will improve, Ability to disclose and discuss suicidal ideas, Ability to demonstrate self-control will improve, Ability to identify and develop effective coping behaviors will improve, Ability to maintain clinical measurements within normal limits will improve, Compliance with prescribed medications will improve and Ability to identify triggers associated with substance abuse/mental health issues will improve  Physician Treatment Plan for Secondary Diagnosis: Active Problems:   Severe recurrent major depression (HCC)    Pt seems to be anxious and  impulsive in context of alcohol use and family conflict.  Plan- Will obtain Collateral .  1. Pt refusing any meds for mood/impulsivity. Denies that drinking alcohol is a problem for him.  2. Group/Milieu tx. 3. Will order/review- cbc, CMP, TSH, QTc, lipid panel, UDS, UA 4. Manage medical problems as appropriate. 5. IVC status. 6. Safe d/c plan.    I certify that inpatient services furnished can reasonably be expected to improve the patient's condition.    Beverly Sessions, MD 3/25/20189:30 AM

## 2016-12-19 NOTE — BHH Counselor (Signed)
Adult Comprehensive Assessment  Patient ID: Corey Boyd, male   DOB: 1966-05-20, 51 y.o.   MRN: [id number]  Information Source: Information source: Patient  Current Stressors:  Educational / Learning stressors: n/a Employment / Job issues: n/a Family Relationships: n/a Surveyor, quantityinancial / Lack of resources (include bankruptcy): Pt reports some financial stress Housing / Lack of housing: n/a Physical health (include injuries & life threatening diseases): n/a Social relationships: n/a Substance abuse: Patient reports drinking alcohol occasionally but does not see it as a problem. Bereavement / Loss: Died about 1 year ago.   Living/Environment/Situation:  Living Arrangements: Spouse/significant other Living conditions (as described by patient or guardian): Patient reports it is fine. How long has patient lived in current situation?: Since August 2017 What is atmosphere in current home: Comfortable, Supportive, ParamedicLoving, Other (Comment) (Patient reports they do argue a lot. )  Family History:  Marital status: Married Number of Years Married: 1 What types of issues is patient dealing with in the relationship?: Communication issues causing arguments Additional relationship information: Patient stated they dated for 11 years and then she cheated and they separated but they reconnected recently.  Are you sexually active?: Yes What is your sexual orientation?: heterosexual Has your sexual activity been affected by drugs, alcohol, medication, or emotional stress?: n/a Does patient have children?: Yes How many children?: 2 How is patient's relationship with their children?: 2 sons. Have a great relationship with them, both are in the [employer].   Childhood History:  By whom was/is the patient raised?: Both parents Additional childhood history information: Father wasn't around much in childhood. Description of patient's relationship with caregiver when they were a child: Father was mostly absent.  Patient was really close with his mother. Patient's description of current relationship with people who raised him/her: Father is deceased. Patient still has good relationship with his mother.  How were you disciplined when you got in trouble as a child/adolescent?: n/a Does patient have siblings?: Yes Number of Siblings: 2 Description of patient's current relationship with siblings: 1 brother and 1 sister. Reports having a good relationship with both siblings but closer to sister.  Did patient suffer any verbal/emotional/physical/sexual abuse as a child?: No Did patient suffer from severe childhood neglect?: No Has patient ever been sexually abused/assaulted/raped as an adolescent or adult?: No Was the patient ever a victim of a crime or a disaster?: No Witnessed domestic violence?: No Has patient been effected by domestic violence as an adult?: No  Education:  Highest grade of school patient has completed: 12 th Currently a student?: No Name of school: n/a Learning disability?: No  Employment/Work Situation:   Employment situation: Employed Where is patient currently employed?: [employer] How long has patient been employed?: 5 years Patient's job has been impacted by current illness: No What is the longest time patient has a held a job?: 11 years Where was the patient employed at that time?: [employer] Has patient ever been in the [employer] and Companymilitary?: No Has patient ever served in combat?: No Did You Receive Any Psychiatric Treatment/Services While in Equities traderthe Military?: No Are There Guns or Other Weapons in Your Home?: Yes Types of Guns/Weapons: 3 handguns and 1 shotgun Are These Weapons Safely Secured?: No Who Could Verify You Are Able To Have These Secured:: Patient's wife. CSW will call. Patient gave permission for staff to speak with wife. Patient also disclosed locations of the guns.   Financial Resources:   Financial resources: Income from employment, Income from spouse Does  patient  have a representative payee or guardian?: No  Alcohol/Substance Abuse:   What has been your use of drugs/alcohol within the last 12 months?: Alcohol If attempted suicide, did drugs/alcohol play a role in this?: No (Patient denies any previous suicide attempts) Has alcohol/substance abuse ever caused legal problems?: No  Social Support System:   Patient's Community Support System: Good Describe Community Support System: Patient has support from wife, siblings, 2 sons, and his mother.  Type of faith/religion: [employer]ri[employer] Knuckles How does patient's faith help to cope with current illness?: "I feel my faith helps in every aspect of life".   Leisure/Recreation:   Leisure and Hobbies: [name] out with my wife, Designer, fashion/clothing.   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  In what areas does patient struggle / problems for patient: Marital issues, patient states he gets sad thinking about his marriage.   Discharge Plan:   Does patient have access to transportation?: Yes Will patient be returning to same living situation after discharge?: Yes Currently receiving community mental health services: No If no, would patient like referral for services when discharged?: No (Patient lives in [place] and wants Marriage counseling. ) Does patient have financial barriers related to discharge medications?: No  Summary/Recommendations:   Patient is a 51 year old male admitted involuntarily with a diagnosis of Severe recurrent major depression. Information was obtained from psychosocial assessment completed with patient and chart review conducted by this evaluator. Patient presented to the hospital IVC for suicidal ideations and alcohol intoxication. Patient reports putting gun to his head threatening to shoot himself. Patient reports primary triggers for admission were martial conflicts with his wife. Patient states he is interested in marriage  counseling. Patient denies SI/HI during assessment. Patient will benefit from crisis stabilization, medication evaluation, group therapy and psycho education in addition to case management for discharge. At discharge, it is recommended that patient remain compliant with established discharge plan and continued treatment.   Corey Boyd G. Garnette Czech MSW, [facility] 12/19/2016 4:37 PM

## 2016-12-19 NOTE — Plan of Care (Signed)
Problem: Coping: Goal: Ability to interact with others will improve Outcome: Progressing Seen interacting appropriately with staff and peers on unit.

## 2016-12-19 NOTE — Progress Notes (Signed)
Patient is calm on unit, interacting appropriately with staff and peers. Has no complaints. Denies SI.HI.AVH. He is pleasant and cooperative, no bizarre behavior or gestures noted.  Will continue to monitor and maintain a safe environment.

## 2016-12-19 NOTE — B[facility] Group Notes (Signed)
[facility] Group Notes:  (Nursing/MHT/Case Management/Adjunct)  Date:  12/19/2016  Time:  2:25 AM  Type of Therapy:  Psychoeducational Skills  Participation Level:  Active  Participation Quality:  Appropriate  Affect:  Appropriate  Cognitive:  Appropriate  Insight:  Appropriate  Engagement in Group:  Engaged  Modes of Intervention:  Discussion, Socialization and Support  Summary of Progress/Problems:  Corey Boyd Y Corey Boyd 12/19/2016, 2:25 AM 

## 2016-12-19 NOTE — Progress Notes (Signed)
D: Pt denies SI/HI/AVH. Pt is pleasant and cooperative, affect is bright, thoughts are organized, no bizarre behavior noted. Patient appears less anxious and he is interacting with peers and staff appropriately.  A: Pt was offered support and encouragement. Pt was given scheduled medications. Pt was encouraged to attend groups. Q 15 minute checks were done for safety.  R:Pt attends groups and interacts well with peers and staff. Pt is taking medication. Pt has no complaints.Pt receptive to treatment and safety maintained on unit.

## 2016-12-19 NOTE — BHH Suicide Risk Assessment (Signed)
[facility] Admission Suicide Risk Assessment   Nursing information obtained from:  Patient Demographic factors:  Male, Caucasian, Divorced or widowed, Access to firearms Current Mental Status:  Self-harm behaviors (denies SI, but held gun to head prior to admission) Loss Factors:  NA Historical Factors:  Family history of mental illness or substance abuse Risk Reduction Factors:  Sense of responsibility to family, Employed, Living with another person, especially a relative  Total Time spent with patient: 15 minutes Principal Problem: <principal problem not specified> Diagnosis:   Patient Active Problem List   Diagnosis Date Noted  . Alcohol abuse [F10.10]   . Severe recurrent major depression (HCC) [F33.2] 12/18/2016   Subjective Data:  c/c- "I did something stupid"  Corey Boyd an 51 y.o.malewith no prior psych hx, admitted from ED. Pt presenting to the ED for suicidal ideations and alcohol intoxication. According to the IVC, pt placed a gun to his chin and said he was going to blow his brains out. Patient made these threat in front of his girlfriend and grandchildren. Per report, Pt  had made similar threats in the past also.  Last night( 3/23) he did this in the company of a 51-year-old child, child protective services involved. Per report, wife  feels that she is unsafe for him as he is pointed the gun at her when he is intoxicated and he is jealous and irrational.  the patient hid the gun somewhere and no one knows exactly where it is. BAL-99, UDS- negative. The presents as calm,  somewhat guarded, states "I did something stupid", states he  had a bottle of wine and another couple of glasses, and was intoxicated but has memory of all events. denies suicidal intent. He reports that he and girlfriend were drinking and arguing  and he made the suicidal gesture out anger and intoxication.  Denies significant relationship problem( may be minimizing).   he wishes he could go  home. States he drinks about a bottle of wine on some weekends, denies daily drinking. States his wife drinks almost daily , but functional.   Pt currently denies SI/HI. He denies any auditory/visual hallucinations.  Continued Clinical Symptoms:  Alcohol Use Disorder Identification Test Final Score (AUDIT): 13 The "Alcohol Use Disorders Identification Test", Guidelines for Use in Primary Care, Second Edition.  World Science writer [facility]). Score between 0-7:  no or low risk or alcohol related problems. Score between 8-15:  moderate risk of alcohol related problems. Score between 16-19:  high risk of alcohol related problems. Score 20 or above:  warrants further diagnostic evaluation for alcohol dependence and treatment.   CLINICAL FACTORS:   Severe Anxiety and/or Agitation Alcohol/Substance Abuse/Dependencies   Musculoskeletal: Strength & Muscle Tone: within normal limits Gait & Station: normal Patient leans:   Psychiatric Specialty Exam: Physical Exam  Nursing note and vitals reviewed.   ROS  Blood pressure 134/80, pulse 69, temperature 98.4 F (36.9 C), temperature source Oral, resp. rate 18, height 6' (1.829 m), weight 83 kg (183 lb), SpO2 100 %.Body mass index is 24.82 kg/m.  General Appearance: Negative  Eye Contact:  Fair  Speech:  Normal Rate  Volume:  Normal  Mood:  Anxious  Affect:  Appropriate  Thought Process:  Goal Directed  Orientation:  Full (Time, Place, and Person)  Thought Content:  Guilt about the incident  Suicidal Thoughts:  No  Homicidal Thoughts:  No  Memory:  intact  Judgement:  Impaired  Insight:  Shallow  Psychomotor Activity:  Normal  Concentration:  fair  Recall:  Good  Fund of Knowledge:  Good  Language:  Good  Akathisia:  No  Handed:    AIMS (if indicated):     Assets:  Communication Skills Desire for Improvement Housing Social Support  ADL's:  Intact  Cognition:  WNL  Sleep:         COGNITIVE FEATURES THAT CONTRIBUTE  TO RISK:  minimizing  SUICIDE RISK:  high PLAN OF CARE:  Pt seems to be anxious and  impulsive in context of alcohol use and family conflict.  Plan- Will obtain Collateral .  1. Pt refusing any meds for mood/impulsivity. Denies that drinking alcohol is a problem for him.  2. Group/Milieu tx. 3. Will order/review- cbc, CMP, TSH, QTc, lipid panel, UDS, UA 4. Manage medical problems as appropriate. 5. IVC status. 6. Safe d/c plan.  I certify that inpatient services furnished can reasonably be expected to improve the patient's condition.   Beverly SessionsJagannath Corrinne Benegas, MD 12/19/2016, 12:40 PM

## 2016-12-20 DIAGNOSIS — F332 Major depressive disorder, recurrent severe without psychotic features: Principal | ICD-10-CM

## 2016-12-20 NOTE — Progress Notes (Signed)
  [facility] :  Will you be returning to the same living situation after discharge:  Yes,  home with family. At discharge, do you have transportation home?: Yes,  wife will pick pt up. Do you have the ability to pay for your medications: No.  Release of information consent forms completed and in the chart;  Patient's signature needed at discharge.  Patient to Follow up at: Follow-up Information    [facility] Go on [facility].   Why:  Please follow-up with [facility] for your medication management and therapy services on March 28th at [facility]8AM. Please bring discharge paperwork to this appt. Questions, contact Unk PintoHarvey Bryant @ [phone]. Contact information: [address] [place] [address] [phone]           Next level of care provider has [facility] to [facility]:no  Safety Planning and Suicide Prevention discussed: Yes,  SPE completed with patient and wife.  Have you used any form of tobacco in the last 30 days? (Cigarettes, Smokeless Tobacco, Cigars, and/or Pipes): Yes  Has patient been referred to the Quitline?: Patient refused referral  Patient has been referred for addiction treatment: Pt. refused referral  Lynden OxfordKadijah R Evaluna Utke, MSW, LCSW-A 12/20/2016, 11:21 AM

## 2016-12-20 NOTE — [facility] Suicide Risk Assessment (Signed)
[facility] INPATIENT:  Family/Significant Other Suicide Prevention Education  Suicide Prevention Education:  Education Completed;Jill Wohlfarth(wife [phone]),   has been identified by the patient as the family member/significant other with whom the patient will be residing, [phone] identified as the person(s) who will aid the patient in the event of a mental health crisis (suicidal ideations/suicide attempt).  With written consent from the patient, the family member/significant other has been provided the following suicide prevention education, prior to the and/or following the discharge of the patient.  The suicide prevention education provided includes the following:  Suicide risk factors  Suicide prevention and interventions  National Suicide Hotline telephone number  [facility] assessment telephone number  [facility] Emergency Assistance 911  [facility] and/or Residential Mobile Crisis Unit telephone number  Request made of family/significant other to:  Remove weapons (e.g., guns, rifles, knives), all items previously/currently identified as safety concern. [name] reports pt's mother and sister came to the home yesterday and removed all the guns from the home.   Remove drugs/medications (over-the-counter, prescriptions, illicit drugs), all items previously/currently identified as a safety concern.  The family member/significant other verbalizes understanding of the suicide prevention education information provided.  The family member/significant other agrees to remove the items of safety concern listed above.  [name] reports Child Protective Services is not involved to her knowledge, however, this sort of incident has occurred several times before.  The fact that this time it occurred in front of the grandchildren has made her decide to move towards a permanent separation from pt, although he does not yet know this.  Pt will be going with his mother upon discharge  today.  Lorri FrederickWierda, Tamon Parkerson Jon, LCSW 12/20/2016, 9:18 AM

## 2016-12-20 NOTE — [facility] Suicide Risk Assessment (Signed)
[facility]   Principal Problem: Major depressive disorder, recurrent severe without psychotic features [facility](HCC) Discharge Diagnoses:  Patient Active Problem List   Diagnosis Date Noted  . Tobacco use disorder [F17.200] 12/19/2016  . Alcohol use disorder, moderate, dependence (HCC) [F10.20]   . Major depressive disorder, recurrent severe without psychotic features (HCC) [F33.2] 12/18/2016    Total Time spent with patient: 30 minutes  Musculoskeletal: Strength & Muscle Tone: within normal limits Gait & Station: normal Patient leans: N/A  Psychiatric Specialty Exam: Review of Systems  Psychiatric/Behavioral: Positive for substance abuse.  All other systems reviewed and are negative.   Blood pressure 117/81, pulse 84, temperature 98.4 F (36.9 C), temperature source Oral, resp. rate 20, height 6' (1.829 m), weight 83 kg (183 lb), SpO2 100 %.Body mass index is 24.82 kg/m.  General Appearance: Casual  Eye Contact::  Good  Speech:  Clear and Coherent409  Volume:  Normal  Mood:  Euthymic  Affect:  Appropriate  Thought Process:  Goal Directed and Descriptions of Associations: Intact  Orientation:  Full (Time, Place, and Person)  Thought Content:  WDL  Suicidal Thoughts:  No  Homicidal Thoughts:  No  Memory:  Immediate;   Fair Recent;   Fair Remote;   Fair  Judgement:  Impaired  Insight:  Present  Psychomotor Activity:  Normal  Concentration:  Fair  Recall:  Fis[employer]Fair  Fund of Knowledge:Fair  Language: Fair  Akathisia:  No  Handed:  Right  AIMS (if indicated):     Assets:  Communication Skills Desire for Improvement Housing Intimacy Physical Health Resilience Social Support Transportation Vocational/Educational  Sleep:  Number of Hours: 8.15  Cognition: WNL  ADL's:  Intact   Mental Status Per Nursing Assessment::   On Admission:  Self-harm behaviors (denies SI, but held gun to head prior to admission)  Demographic Factors:  Male and  Caucasian  Loss Factors: NA  Historical Factors: Impulsivity  Risk Reduction Factors:   Responsible for children under 50 years of age, Sense of responsibility to family, Employed, Living with another person, especially a relative and Positive social support  Continued Clinical Symptoms:  Depression:   Comorbid alcohol abuse/dependence Impulsivity Alcohol/Substance Abuse/Dependencies  Cognitive Features That Contribute To Risk:  None    Suicide Risk:  Minimal: No identifiable suicidal ideation.  Patients presenting with no risk factors but with morbid ruminations; may be classified as minimal risk based on the severity of the depressive symptoms    Plan Of Care/Follow-up recommendations:  Activity:  as tolerated. Diet:  low sodium heart healthy. Other:  keep follow up appointments.  Kristine LineaJolanta Tranae Laramie, MD 12/20/2016, 8:42 AM

## 2016-12-20 NOTE — Discharge Summary (Signed)
Physician Discharge Summary Note  Patient:  Corey Boyd is an 51 y.o., male MRN:  [id number] DOB:  July 30, 1966 Patient phone:  [phone] (home)  Patient address:   [address] [place] [id number],  Total Time spent with patient: 30 minutes  Date of Admission:  12/18/2016 Date of Discharge: 12/20/2916  Reason for Admission:  Suicidal threats.   Chief complaint. "I did something stupid"  Corey Boyd an 51 y.o.malewith no prior psych hx, admitted from ED. Pt presenting to the ED for suicidal ideations and alcohol intoxication. According to the IVC, pt placed a gun to his chin and said he was going to blow his brains out. Patient made these threat in front of his girlfriend and grandchildren. Per report, Pt had made similar threats in the past also. Last night( 3/23)he did this in the company of a 50-year-old child, child protective services involved. Per report, wife feels that she is unsafe for him as he is pointed the gun at her when he is intoxicated and he is jealous and irrational. the patient hidthe gun somewhere and no one knows exactly where it is. BAL-99, UDS- negative. The presents as calm, somewhat guarded, states "I did something stupid", states he had a bottle of wine and another couple of glasses, and was intoxicated but has memory of all events.denies suicidal intent. He reports that he and girlfriend were drinking and arguing and he made the suicidal gesture out anger and intoxication. Denies significant relationship problem( may be minimizing). he wishes he could go home. States he drinks about a bottle of wine on some weekends, denies daily drinking. States his wife drinks almost daily , but functional.  Pt currently denies SI/HI. He denies any auditory/visual hallucinations.  Principal Problem: Major depressive disorder, recurrent severe without psychotic features [facility](HCC) Discharge Diagnoses: Patient Active Problem List    Diagnosis Date Noted  . Tobacco use disorder [F17.200] 12/19/2016  . Alcohol use disorder, moderate, dependence (HCC) [F10.20]   . Major depressive disorder, recurrent severe without psychotic features (HCC) [F33.2] 12/18/2016    Past Psychiatric History: None.  Past Medical History: History reviewed. No pertinent past medical history.  Past Surgical History:  Procedure Laterality Date  . HERNIA REPAIR    . LEG SURGERY    . VASECTOMY     Family History: History reviewed. No pertinent family history. Family Psychiatric  History: None. Social History:  History  Alcohol Use  . Yes    Comment: occasionally, weekends     History  Drug Use No    Comment: denies     Social History   Social History  . Marital status: Single    Spouse name: N/A  . Number of children: N/A  . Years of education: N/A   Social History Main Topics  . Smoking status: Current Every Day Smoker    Packs/day: 1.00  . Smokeless tobacco: Never Used     Comment: refuses nicotene patch  . Alcohol use Yes     Comment: occasionally, weekends  . Drug use: No     Comment: denies   . Sexual activity: Not Asked   Other Topics Concern  . None   Social History Narrative  . None    Hospital Course:    Mr. Corey Boyd is a 51 year old male with No past psychiatric history except alcoholism admitted for suicidal threats with a gun while drunk.  1. Suicidal ideation. The patient adamantly denies any thoughts, intentions, or plans to hurt himself or  others. He is able to contract for safety. He is forward thinking and optimistic about the future. He is a loving husband. All guns were removed from the house by his brother.  2. Depression. The patient admits that he feels somewhat depressed while drinking but is not interested in any pharmacotherapy. He wants to go to marital counseling with his wife following discharge.  3. Alcohol abuse. There were no symptoms of alcohol withdrawal. The patient is not interested  in substance abuse treatment including pharmacotherapy for alcoholism.  4. Insomnia. Trazodone was available.  5. Smoking. Nicotine patch was available.   6. Disposition. He was discharged to home with his wife. He will [facility] up with [facility].  Physical Findings: AIMS: Facial and Oral Movements Muscles of Facial Expression: None, normal Lips and Perioral Area: None, normal Jaw: None, normal Tongue: None, normal,Extremity Movements Upper (arms, wrists, hands, fingers): None, normal Lower (legs, knees, ankles, toes): None, normal, Trunk Movements Neck, shoulders, hips: None, normal, Overall Severity Severity of abnormal movements (highest score from questions above): None, normal Incapacitation due to abnormal movements: None, normal Patient's awareness of abnormal movements (rate only patient's report): No Awareness, Dental Status Current problems with teeth and/or dentures?: No Does patient usually wear dentures?: No  CIWA:    COWS:     Musculoskeletal: Strength & Muscle Tone: within normal limits Gait & Station: normal Patient leans: N/A  Psychiatric Specialty Exam: Physical Exam  Nursing note and vitals reviewed. Psychiatric: He has a normal mood and affect. His speech is normal and behavior is normal. Thought content normal. Cognition and memory are normal. He expresses impulsivity.    Review of Systems  Psychiatric/Behavioral: Positive for substance abuse.  All other systems reviewed and are negative.   Blood pressure 117/81, pulse 84, temperature 98.4 F (36.9 C), temperature source Oral, resp. rate 20, height 6' (1.829 m), weight 83 kg (183 lb), SpO2 100 %.Body mass index is 24.82 kg/m.  General Appearance: Casual  Eye Contact:  Good  Speech:  Clear and Coherent  Volume:  Normal  Mood:  Euthymic  Affect:  Appropriate  Thought Process:  Goal Directed and Descriptions of Associations: Intact  Orientation:  Full (Time, Place, and Person)  Thought Content:  WDL   Suicidal Thoughts:  No  Homicidal Thoughts:  No  Memory:  Immediate;   Fair Recent;   Fair Remote;   Fair  Judgement:  Impaired  Insight:  Present  Psychomotor Activity:  Normal  Concentration:  Concentration: Fair and Attention Span: Fair  Recall:  [employer] of Knowledge:  Fair  Language:  Fair  Akathisia:  No  Handed:  Right  AIMS (if indicated):     Assets:  Communication Skills Desire for Improvement Housing Intimacy Physical Health Resilience Social Support Transportation Vocational/Educational  ADL's:  Intact  Cognition:  WNL  Sleep:  Number of Hours: 8.15     Have you used any form of tobacco in the last 30 days? (Cigarettes, Smokeless Tobacco, Cigars, and/or Pipes): Yes  Has this patient used any form of tobacco in the last 30 days? (Cigarettes, Smokeless Tobacco, Cigars, and/or Pipes) Yes, Yes, A prescription for an FDA-approved tobacco cessation medication was offered at discharge and the patient refused  Blood Alcohol level:  Lab Results  Component Value Date   ETH 99 (H) 12/18/2016    Metabolic Disorder Labs:  No results found for: HGBA1C, MPG No results found for: PROLACTIN No results found for: CHOL, TRIG, HDL, CHOLHDL, VLDL, LDLCALC  See Psychiatric Specialty Exam and Suicide Risk Assessment completed by Attending Physician prior to discharge.  Discharge destination:  Home  Is patient on multiple antipsychotic therapies at discharge:  No   Has Patient had three or more failed trials of antipsychotic monotherapy by history:  No  Recommended Plan for Multiple Antipsychotic Therapies: NA  Discharge Instructions    Diet - low sodium heart healthy    Complete by:  As directed    Increase activity slowly    Complete by:  As directed      Allergies as of 12/20/2016   No Known Allergies     Medication List    You have not been prescribed any medications.      Follow-up recommendations:  Activity:  As tolerated. Diet:  Low sodium heart  healthy. Other:  Keep follow-up appointments.  Comments:    Signed: Kristine Linea, MD 12/20/2016, 8:45 AM

## 2016-12-20 NOTE — [facility] Group Notes (Signed)
[facility] LCSW Group Therapy   12/20/2016 9:30 am Type of Therapy: Group Therapy   Participation Level: Pt invited but did not attend.  Participation Quality: Pt invited but did not attend.  Summary of Progress/Problems: Pt identified obstacles faced currently and processed barriers involved in overcoming these obstacles. Pt identified steps necessary for overcoming these obstacles and explored motivation (internal and external) for facing these difficulties head on. Pt further identified one area of concern in their lives and chose a goal to focus on for today.   Hampton AbbotKadijah Jaquelin Meaney, MSW, LCSWA 12/20/2016, 10:34AM

## 2016-12-20 NOTE — Progress Notes (Signed)
Pleasant and cooperative. Denies SI/HI/AVH.   Discharge instructions given, verbalized understanding.  Personal belongings returned.  Escorted off unit by this Clinical research associatewriter to main entrance to travel home with his family.

## 2016-12-20 NOTE — Plan of Care (Signed)
Problem: Coping: Goal: Ability to use eye contact when communicating with others will improve Outcome: Progressing Pt used eye contact when communicating with peers and staff this shift.

## 2016-12-20 NOTE — Tx Team (Signed)
Interdisciplinary Treatment and Diagnostic Plan Update  12/20/2016 Time of Session: 10:30 AM Corey Boyd MRN: [id number]  Principal Diagnosis: Major depressive disorder, recurrent severe without psychotic features (HCC)  Secondary Diagnoses: Principal Problem:   Major depressive disorder, recurrent severe without psychotic features (HCC) Active Problems:   Alcohol use disorder, moderate, dependence (HCC)   Tobacco use disorder   Current Medications:  Current Facility-Administered Medications  Medication Dose Route Frequency Provider Last Rate Last Dose  . acetaminophen (TYLENOL) tablet 650 mg  650 mg Oral Q6H PRN Beverly Sessions, MD      . alum & mag hydroxide-simeth (MAALOX/MYLANTA) 200-200-20 MG/5ML suspension 30 mL  30 mL Oral Q4H PRN Beverly Sessions, MD      . hydrOXYzine (ATARAX/VISTARIL) tablet 25 mg  25 mg Oral TID PRN Beverly Sessions, MD      . magnesium hydroxide (MILK OF MAGNESIA) suspension 30 mL  30 mL Oral Daily PRN Beverly Sessions, MD      . traZODone (DESYREL) tablet 50 mg  50 mg Oral QHS PRN Beverly Sessions, MD       PTA Medications: No prescriptions prior to admission.    Patient Stressors: Marital or family conflict Substance abuse  Patient Strengths: Ability for insight Average or above average intelligence Capable of independent living Contractor Physical Health Work skills  Treatment Modalities: Medication Management, Group therapy, Case management,  1 to 1 session with clinician, Psychoeducation, Recreational therapy.   Physician Treatment Plan for Primary Diagnosis: Major depressive disorder, recurrent severe without psychotic features (HCC) Long Term Goal(s): Improvement in symptoms so as ready for discharge   Short Term Goals: Ability to identify changes in lifestyle to reduce recurrence of condition will improve Ability to verbalize feelings will improve Ability to disclose and discuss suicidal ideas Ability to demonstrate  self-control will improve Ability to identify and develop effective coping behaviors will improve Ability to maintain clinical measurements within normal limits will improve Compliance with prescribed medications will improve Ability to identify triggers associated with substance abuse/mental health issues will improve  Medication Management: Evaluate patient's response, side effects, and tolerance of medication regimen.  Therapeutic Interventions: 1 to 1 sessions, Unit Group sessions and Medication administration.  Evaluation of Outcomes: Adequate for discharge.  Physician Treatment Plan for Secondary Diagnosis: Principal Problem:   Major depressive disorder, recurrent severe without psychotic features (HCC) Active Problems:   Alcohol use disorder, moderate, dependence (HCC)   Tobacco use disorder  Long Term Goal(s): Improvement in symptoms so as ready for discharge   Short Term Goals: Ability to identify changes in lifestyle to reduce recurrence of condition will improve Ability to verbalize feelings will improve Ability to disclose and discuss suicidal ideas Ability to demonstrate self-control will improve Ability to identify and develop effective coping behaviors will improve Ability to maintain clinical measurements within normal limits will improve Compliance with prescribed medications will improve Ability to identify triggers associated with substance abuse/mental health issues will improve     Medication Management: Evaluate patient's response, side effects, and tolerance of medication regimen.  Therapeutic Interventions: 1 to 1 sessions, Unit Group sessions and Medication administration.  Evaluation of Outcomes: Adequate for discharge    RN Treatment Plan for Primary Diagnosis: Major depressive disorder, recurrent severe without psychotic features (HCC) Long Term Goal(s): Knowledge of disease and therapeutic regimen to maintain health will improve  Short Term Goals:  Ability to demonstrate self-control, Ability to disclose and discuss suicidal ideas and Compliance with prescribed medications will improve  Medication Management:  RN will administer medications as ordered by provider, will assess and evaluate patient's response and provide education to patient for prescribed medication. RN will report any adverse and/or side effects to prescribing provider.  Therapeutic Interventions: 1 on 1 counseling sessions, Psychoeducation, Medication administration, Evaluate responses to treatment, Monitor vital signs and CBGs as ordered, Perform/monitor CIWA, COWS, AIMS and Fall Risk screenings as ordered, Perform wound care treatments as ordered.  Evaluation of Outcomes: Adequate for Discharge   LCSW Treatment Plan for Primary Diagnosis: Major depressive disorder, recurrent severe without psychotic features (HCC) Long Term Goal(s): Safe transition to appropriate next level of care at discharge, Engage patient in therapeutic group addressing interpersonal concerns.  Short Term Goals: Engage patient in aftercare planning with referrals and resources, Increase social support, Increase ability to appropriately verbalize feelings and Increase skills for wellness and recovery  Therapeutic Interventions: Assess for all discharge needs, 1 to 1 time with Social worker, Explore available resources and support systems, Assess for adequacy in community support network, Educate family and significant other(s) on suicide prevention, Complete Psychosocial Assessment, Interpersonal group therapy.  Evaluation of Outcomes: Adequate for Discharge   Progress in Treatment: Attending groups: Yes. Participating in groups: Yes. Taking medication as prescribed: Yes. Toleration medication: Yes. Family/Significant other contact made: Yes, individual(s) contacted:  CSW contacted pt's wife. Patient understands diagnosis: Yes. Discussing patient identified problems/goals with staff:  Yes. Medical problems stabilized or resolved: Yes. Denies suicidal/homicidal ideation: Yes. Issues/concerns per patient self-inventory: No.  New problem(s) identified: No, Describe:  None identified.  New Short Term/Long Term Goal(s): Pt's goal is to discharge and practice healthy coping mechanisms.   Discharge Plan or Barriers: Pt will discharge home with wife and f/u with [facility].   Reason for Continuation of Hospitalization: Depression Suicidal ideation  Estimated Length of Stay: D/C 12/20/2016  Attendees: Patient: Corey Boyd  12/20/2016 11:15 AM  Physician: Dr. Kristine LineaJolanta Pucilowska, MD 12/20/2016 11:15 AM  Nursing: Leonia ReaderPhyllis Cobb, BSN, RN 12/20/2016 11:15 AM  RN Care Manager: 12/20/2016 11:15 AM  Social Worker: Hampton AbbotKadijah Trixie Maclaren, MSW, LCSW-A 12/20/2016 11:15 AM  Recreational Therapist: Princella IonElizabeth Greene, LRT, CTRS  12/20/2016 11:15 AM  Other:  12/20/2016 11:15 AM  Other:  12/20/2016 11:15 AM  Other: 12/20/2016 11:15 AM    Scribe for Treatment Team: Lynden OxfordKadijah R Marguarite Markov, LCSWA 12/20/2016 11:15 AM

## 2016-12-20 NOTE — Progress Notes (Signed)
Recreation Therapy Notes  Date: 03.26.18 Time: 1:00 pm Location: Craft Room  Group Topic: Wellness  Goal Area(s) Addresses:  Patient will identify at least one item per dimension of health. Patient will examine areas they are deficient in.  Behavioral Response: Did not attend  Intervention: 6 Dimensions of Health  Activity: Patients were given a definition sheet defining the 6 Dimensions of Health. Patients were given a worksheet with each dimension listed and were instructed to write items they were currently doing in each dimension.  Education: LRT educated patients on ways they can improve each dimension.  Education Outcome: Patient did not attend group.  Clinical Observations/Feedback: Patient did not attend group.  Jacquelynn CreeGreene,Bertis Hustead M, LRT/CTRS 12/20/2016 1:54 PM

## 2016-12-20 NOTE — Progress Notes (Signed)
  [facility] Management Discharge Plan :  Will you be returning to the same living situation after discharge:  No. At discharge, do you have transportation home?: Yes,  mother Do you have the ability to pay for your medications: No. Pt declined referral to Medication Management clinic.  Release of information consent forms completed and in the chart;  Patient's signature needed at discharge.  Patient to Follow up at: Follow-up Information    [facility]. Go on 12/22/2016.   Why:  Please follow-up with [facility] for your medication management and therapy services on March 28th at [facility]8AM. Please bring discharge paperwork to this appt. Questions, contact Unk PintoHarvey Bryant @ [phone]. Contact information: [address] [place] [id number] [phone]           Next level of care provider has access to [facility] Link:no  Safety Planning and Suicide Prevention discussed: Yes,  wife  Have you used any form of tobacco in the last 30 days? (Cigarettes, Smokeless Tobacco, Cigars, and/or Pipes): Yes  Has patient been referred to the Quitline?: Patient refused referral  Patient has been referred for addiction treatment: Yes  Lorri FrederickWierda, Ava Tangney Jon, LCSW 12/20/2016, 11:50 AM

## 2016-12-20 NOTE — Progress Notes (Signed)
Denies SI/HI/AVH. Forwards little at this time. Visible on unit with appropriate behaviors. Denies pain. Voices no concerns at this time. Encouragement and support provided. Safety maintained. Will continue to monitor.

## 2017-10-13 ENCOUNTER — Encounter: Payer: Self-pay | Admitting: Surgery

## 2017-10-13 ENCOUNTER — Ambulatory Visit: Payer: Self-pay | Admitting: Surgery

## 2017-10-13 VITALS — BP 162/90 | HR 79 | Temp 98.5°F | Ht 72.0 in | Wt 198.0 lb

## 2017-10-13 DIAGNOSIS — K4090 Unilateral inguinal hernia, without obstruction or gangrene, not specified as recurrent: Secondary | ICD-10-CM

## 2017-10-13 NOTE — Patient Instructions (Signed)
You have chose to have your hernia repaired. This will be done by Dr. Everlene FarrierPabon on 11/17/17 [facility].  Please see your (blue) Pre-care information that you have been given today.  You will need to arrange to be out of work for 2 weeks and then return with a lifting restrictions for 4 more weeks. Please send any FMLA paperwork prior to surgery and we will fill this out and fax it back to your employer within 3 business days.  You may have a bruise in your groin and also swelling and brusing in your testicle area. You may use ice 4-5 times daily for 15-20 minutes each time. Make sure that you place a barrier between you and the ice pack. To decrease the swelling, you may roll up a bath towel and place it vertically in between your thighs with your testicles resting on the towel. You will want to keep this area elevated as much as possible for several days following surgery.    Inguinal Hernia, Adult Muscles help keep everything in the body in its proper place. But if a weak spot in the muscles develops, something can poke through. That is called a hernia. When this happens in the lower part of the belly (abdomen), it is called an inguinal hernia. (It takes its name from a part of the body in this region called the inguinal canal.) A weak spot in the wall of muscles lets some fat or part of the small intestine bulge through. An inguinal hernia can develop at any age. Men get them more often than women. CAUSES  In adults, an inguinal hernia develops over time.  It can be triggered by:  Suddenly straining the muscles of the lower abdomen.  Lifting heavy objects.  Straining to have a bowel movement. Difficult bowel movements (constipation) can lead to this.  Constant coughing. This may be caused by smoking or lung disease.  Being overweight.  Being pregnant.  Working at a job that requires long periods of standing or heavy lifting.  Having had an inguinal hernia before. One type can be an  emergency situation. It is called a strangulated inguinal hernia. It develops if part of the small intestine slips through the weak spot and cannot get back into the abdomen. The blood supply can be cut off. If that happens, part of the intestine may die. This situation requires emergency surgery. SYMPTOMS  Often, a small inguinal hernia has no symptoms. It is found when a healthcare provider does a physical exam. Larger hernias usually have symptoms.   In adults, symptoms may include:  A lump in the groin. This is easier to see when the person is standing. It might disappear when lying down.  In men, a lump in the scrotum.  Pain or burning in the groin. This occurs especially when lifting, straining or coughing.  A dull ache or feeling of pressure in the groin.  Signs of a strangulated hernia can include:  A bulge in the groin that becomes very painful and tender to the touch.  A bulge that turns red or purple.  Fever, nausea and vomiting.  Inability to have a bowel movement or to pass gas. DIAGNOSIS  To decide if you have an inguinal hernia, a healthcare provider will probably do a physical examination.  This will include asking questions about any symptoms you have noticed.  The healthcare provider might feel the groin area and ask you to cough. If an inguinal hernia is felt, the healthcare provider  may try to slide it back into the abdomen.  Usually no other tests are needed. TREATMENT  Treatments can vary. The size of the hernia makes a difference. Options include:  Watchful waiting. This is often suggested if the hernia is small and you have had no symptoms.  No medical procedure will be done unless symptoms develop.  You will need to watch closely for symptoms. If any occur, contact your healthcare provider right away.  Surgery. This is used if the hernia is larger or you have symptoms.  Open surgery. This is usually an outpatient procedure (you will not stay  overnight in a hospital). An cut (incision) is made through the skin in the groin. The hernia is put back inside the abdomen. The weak area in the muscles is then repaired by herniorrhaphy or hernioplasty. Herniorrhaphy: in this type of surgery, the weak muscles are sewn back together. Hernioplasty: a patch or mesh is used to close the weak area in the abdominal wall.  Laparoscopy. In this procedure, a surgeon makes small incisions. A thin tube with a tiny video camera (called a laparoscope) is put into the abdomen. The surgeon repairs the hernia with mesh by looking with the video camera and using two long instruments. HOME CARE INSTRUCTIONS   After surgery to repair an inguinal hernia:  You will need to take pain medicine prescribed by your healthcare provider. Follow all directions carefully.  You will need to take care of the wound from the incision.  Your activity will be restricted for awhile. This will probably include no heavy lifting for several weeks. You also should not do anything too active for a few weeks. When you can return to work will depend on the type of job that you have.  During "watchful waiting" periods, you should:  Maintain a healthy weight.  Eat a diet high in fiber (fruits, vegetables and whole grains).  Drink plenty of fluids to avoid constipation. This means drinking enough water and other liquids to keep your urine clear or pale yellow.  Do not lift heavy objects.  Do not stand for long periods of time.  Quit smoking. This should keep you from developing a frequent cough. SEEK MEDICAL CARE IF:   A bulge develops in your groin area.  You feel pain, a burning sensation or pressure in the groin. This might be worse if you are lifting or straining.  You develop a fever of more than 100.5 F (38.1 C). SEEK IMMEDIATE MEDICAL CARE IF:   Pain in the groin increases suddenly.  A bulge in the groin gets bigger suddenly and does not go down.  For men, there  is sudden pain in the scrotum. Or, the size of the scrotum increases.  A bulge in the groin area becomes red or purple and is painful to touch.  You have nausea or vomiting that does not go away.  You feel your heart beating much faster than normal.  You cannot have a bowel movement or pass gas.  You develop a fever of more than 102.0 F (38.9 C).   This information is not intended to replace advice given to you by your health care provider. Make sure you discuss any questions you have with your health care provider.   Document Released: 01/30/2009 Document Revised: 12/06/2011 Document Reviewed: 03/17/2015 Elsevier Interactive Patient Education [employer].

## 2017-10-14 ENCOUNTER — Encounter: Payer: Self-pay | Admitting: Surgery

## 2017-10-14 NOTE — Progress Notes (Signed)
Surgical Consultation  10/14/2017  Corey Boyd is an 52 y.o. male.   Chief Complaint  Patient presents with  . New Patient (Initial Visit)    Left Inguinal Hernia    HPI: A 52 year old patient with a prior history of a right inguinal hernia repair by Dr. Katrinka BlazingSmith several years ago. Now comes with any onset of a left inguinal hernia. She reports intermittent mild pain, sharp in nature. No specific alleviating or aggravating factors.. No evidence of strangulation or incarceration. No evidence of bowel obstruction. Patient had a history of depression and suicidal ideation a few months ago, pt has improved from this. He smokes daily and admits to social drinking. He Is able to perform more than 4 Mets of activity without any shortness of breath or chest pain. I have reviewed his labs and he had a normal LFTs, CBC and creatinine. No evidence of cirrhosis or liver damage.  History reviewed. No pertinent past medical history.  Past Surgical History:  Procedure Laterality Date  . HERNIA REPAIR    . INGUINAL HERNIA REPAIR Right 2005  . LEG SURGERY    . VASECTOMY    . VASECTOMY  2000    Family History  Problem Relation Age of Onset  . Heart disease Father     Social History:  reports that he has been smoking.  He has been smoking about 1.00 pack per day. he has never used smokeless tobacco. He reports that he drinks alcohol. He reports that he does not use drugs.  Allergies: No Known Allergies  Medications reviewed.    ROS Full ROS performed and is otherwise negative other than what is stated in the HPI    BP (!) 162/90   Pulse 79   Temp 98.5 F (36.9 C) (Oral)   Ht 6' (1.829 m)   Wt 89.8 kg (198 lb)   BMI 26.85 kg/m    Physical Exam  Constitutional: He is oriented to person, place, and time and well-developed, well-nourished, and in no distress. No distress.  HENT:  Head: Normocephalic and atraumatic.  Eyes: Right eye exhibits no discharge. Left eye exhibits no  discharge. No scleral icterus.  Neck: Normal range of motion. No JVD present. No tracheal deviation present. No thyromegaly present.  Cardiovascular: Normal rate, regular rhythm and normal heart sounds.  Pulmonary/Chest: Effort normal. No stridor. No respiratory distress. He has no wheezes. He has no rales. He exhibits no tenderness.  Abdominal: Soft. He exhibits no distension and no mass. There is no tenderness. There is no rebound and no guarding.  Reducible Left IH, Previous RIH scar.  Genitourinary: Prostate normal and penis normal.  Neurological: He is oriented to person, place, and time. Gait normal. GCS score is 15.  Skin: Skin is warm and dry. He is not diaphoretic.  Psychiatric: Mood, memory, affect and judgment normal.  Nursing note and vitals reviewed.    Assessment/Plan: Patient with a symptomatic left inguinal hernia repair. Open versus laparoscopic were explained. He is a good candidate for laparoscopic surgery and is interested in a minimally invasive approach. I have explained the procedure, risks, and aftercare of inguinal hernia repair to Devontay Deforest HoylesLee Fernholz.   Risks include but are not limited to bleeding, infection, wound problems, anesthesia, recurrence, bladder or intestine injury, urinary retention, testicular dysfunction, chronic pain, mesh problems.  He  seems to understand and agrees to proceed.  Questions were answered to his stated satisfaction. Plan for Lap Left IH repair w mesh.  Sterling Bigiego Tanikka Bresnan, MD  [place] Surgeon

## 2017-10-18 ENCOUNTER — Telehealth: Payer: Self-pay | Admitting: Surgery

## 2017-10-18 NOTE — Telephone Encounter (Signed)
Pt advised of pre op date/time and sx date. Sx: 11/16/17 with Dr Pabon-laparoscopic left inguinal hernia repair.  Pre op: 11/08/17 between 1-5:00pm--phone interview.   Patient made aware to call [phone], between 1-3:00pm the day before surgery, to find out what time to arrive.    Patient has agreed to pay 407.00-physician estimate prior to surgery.

## 2017-11-08 ENCOUNTER — Other Ambulatory Visit: Payer: Self-pay

## 2017-11-08 ENCOUNTER — Encounter
Admission: RE | Admit: 2017-11-08 | Discharge: 2017-11-08 | Disposition: A | Discharge status: Home / Self Care | Payer: Self-pay | Source: Ambulatory Visit | Attending: Surgery | Admitting: Surgery

## 2017-11-08 HISTORY — DX: Other specified health status: Z78.9

## 2017-11-08 NOTE — Patient Instructions (Signed)
Your procedure is scheduled on: 11-16-17 Report to Same Day Surgery 2nd floor medical mall [facility](Medical Mall Entrance-take elevator on left to 2nd floor.  Check in with surgery information desk.) To find out your arrival time please call [phone] between 1PM - 3PM on 11-15-17  Remember: Instructions that are not followed completely may result in serious medical risk, up to and including death, or upon the discretion of your surgeon and anesthesiologist your surgery may need to be rescheduled.    _x___ 1. Do not eat food after midnight the night before your procedure. NO GUM OR CANDY AFTER MIDNIGHT.  You may drink clear liquids up to 2 hours before you are scheduled to arrive at the hospital for your procedure.  Do not drink clear liquids within 2 hours of your scheduled arrival to the hospital.  Clear liquids include  --Water or Apple juice without pulp  --Clear carbohydrate beverage such as ClearFast or Gatorade  --Black Coffee or Clear Tea (No milk, no creamers, do not add anything to the coffee or Tea      __x__ 2. No Alcohol for 24 hours before or after surgery.   __x__3. No Smoking or e-cigarettes for 24 prior to surgery.  Do not use any chewable tobacco products for at least 6 hour prior to surgery   ____  4. Bring all medications with you on the day of surgery if instructed.    __x__ 5. Notify your doctor if there is any change in your medical condition     (cold, fever, infections).    x___6. On the morning of surgery brush your teeth with toothpaste and water.  You may rinse your mouth with mouth wash if you wish.  Do not swallow any toothpaste or mouthwash.   Do not wear jewelry, make-up, hairpins, clips or nail polish.  Do not wear lotions, powders, or perfumes. You may wear deodorant.  Do not shave 48 hours prior to surgery. Men may shave face and neck.  Do not bring valuables to the hospital.    [facility] is not responsible for any belongings or valuables.      Contacts, dentures or bridgework may not be worn into surgery.  Leave your suitcase in the car. After surgery it may be brought to your room.  For patients admitted to the hospital, discharge time is determined by your treatment team.  _  Patients discharged the day of surgery will not be allowed to drive home.  You will need someone to drive you home and stay with you the night of your procedure.     ____ Take anti-hypertensive listed below, cardiac, seizure, asthma, anti-reflux and psychiatric medicines. These include:  1. NONE  2.  3.  4.  5.  6.  ____Fleets enema or Magnesium Citrate as directed.   ____ Use CHG Soap or sage wipes as directed on instruction sheet   ____ Use inhalers on the day of surgery and bring to hospital day of surgery  ____ Stop Metformin and Janumet 2 days prior to surgery.    ____ Take 1/2 of usual insulin dose the night before surgery and none on the morning surgery.   ____ Follow recommendations from Cardiologist, Pulmonologist or PCP regarding  stopping Aspirin, Coumadin, Plavix ,Eliquis, Effient, or Pradaxa, and Pletal.  X____Stop Anti-inflammatories such as Advil, Aleve, Ibuprofen, Motrin, Naproxen, Naprosyn, Goodies powders or aspirin products NOW- OK to take Tylenol   ____ Stop supplements until after surgery.     ____ [employer]  C-Pap to the hospital.

## 2017-11-08 NOTE — Addendum Note (Signed)
Addended by: Sterling BigPABON, Ghassan Coggeshall F on: 11/08/2017 07:37 PM   Modules accepted: Orders, SmartSet

## 2017-11-15 MED ORDER — CEFAZOLIN SODIUM-DEXTROSE 2-4 GM/100ML-% IV SOLN
2.0000 g | INTRAVENOUS | Status: AC
  Administered 2017-11-16: 2 g via INTRAVENOUS

## 2017-11-16 ENCOUNTER — Ambulatory Visit
Admission: RE | Admit: 2017-11-16 | Discharge: 2017-11-16 | Disposition: A | Discharge status: Home / Self Care | Payer: Self-pay | Source: Ambulatory Visit | Attending: Surgery | Admitting: Surgery

## 2017-11-16 ENCOUNTER — Encounter
Admission: RE | Disposition: A | Discharge status: Home / Self Care | Payer: Self-pay | Source: Ambulatory Visit | Attending: Surgery

## 2017-11-16 ENCOUNTER — Ambulatory Visit: Payer: Self-pay | Admitting: Anesthesiology

## 2017-11-16 DIAGNOSIS — F329 Major depressive disorder, single episode, unspecified: Secondary | ICD-10-CM | POA: Insufficient documentation

## 2017-11-16 DIAGNOSIS — K4090 Unilateral inguinal hernia, without obstruction or gangrene, not specified as recurrent: Secondary | ICD-10-CM

## 2017-11-16 DIAGNOSIS — F17210 Nicotine dependence, cigarettes, uncomplicated: Secondary | ICD-10-CM | POA: Insufficient documentation

## 2017-11-16 HISTORY — PX: INGUINAL HERNIA REPAIR: SHX194

## 2017-11-16 HISTORY — DX: Failed or difficult intubation, initial encounter: T88.4XXA

## 2017-11-16 SURGERY — REPAIR, HERNIA, INGUINAL, LAPAROSCOPIC
Anesthesia: General | Site: Abdomen | Laterality: Left | Wound class: Clean

## 2017-11-16 MED ORDER — CHLORHEXIDINE GLUCONATE CLOTH 2 % EX PADS: 6.0000 | MEDICATED_PAD | Freq: Once | CUTANEOUS | Status: DC

## 2017-11-16 MED ORDER — SUGAMMADEX SODIUM 200 MG/2ML IV SOLN
INTRAVENOUS | Status: AC
  Filled 2017-11-16: qty 2

## 2017-11-16 MED ORDER — GLYCOPYRROLATE 0.2 MG/ML IJ SOLN
INTRAMUSCULAR | Status: AC
  Filled 2017-11-16: qty 1

## 2017-11-16 MED ORDER — CEFAZOLIN SODIUM-DEXTROSE 2-4 GM/100ML-% IV SOLN
INTRAVENOUS | Status: AC
  Filled 2017-11-16: qty 100

## 2017-11-16 MED ORDER — HYDROMORPHONE HCL 1 MG/ML IJ SOLN
INTRAMUSCULAR | Status: AC
  Filled 2017-11-16: qty 1

## 2017-11-16 MED ORDER — PROPOFOL 10 MG/ML IV BOLUS
INTRAVENOUS | Status: DC | PRN
  Administered 2017-11-16: 50 mg via INTRAVENOUS
  Administered 2017-11-16: 150 mg via INTRAVENOUS

## 2017-11-16 MED ORDER — DEXAMETHASONE SODIUM PHOSPHATE 10 MG/ML IJ SOLN
INTRAMUSCULAR | Status: AC
  Filled 2017-11-16: qty 1

## 2017-11-16 MED ORDER — SEVOFLURANE IN SOLN
RESPIRATORY_TRACT | Status: AC
  Filled 2017-11-16: qty 250

## 2017-11-16 MED ORDER — DEXAMETHASONE SODIUM PHOSPHATE 10 MG/ML IJ SOLN
INTRAMUSCULAR | Status: DC | PRN
  Administered 2017-11-16: 10 mg via INTRAVENOUS

## 2017-11-16 MED ORDER — MIDAZOLAM HCL 2 MG/2ML IJ SOLN
INTRAMUSCULAR | Status: DC | PRN
  Administered 2017-11-16: 2 mg via INTRAVENOUS

## 2017-11-16 MED ORDER — KETOROLAC TROMETHAMINE 30 MG/ML IJ SOLN
INTRAMUSCULAR | Status: DC | PRN
  Administered 2017-11-16: 30 mg via INTRAVENOUS

## 2017-11-16 MED ORDER — LIDOCAINE HCL (PF) 2 % IJ SOLN
INTRAMUSCULAR | Status: AC
  Filled 2017-11-16: qty 10

## 2017-11-16 MED ORDER — SUGAMMADEX SODIUM 200 MG/2ML IV SOLN
INTRAVENOUS | Status: DC | PRN
  Administered 2017-11-16: 200 mg via INTRAVENOUS

## 2017-11-16 MED ORDER — LACTATED RINGERS IV SOLN
INTRAVENOUS | Status: DC
  Administered 2017-11-16 (×3): via INTRAVENOUS

## 2017-11-16 MED ORDER — ONDANSETRON HCL 4 MG/2ML IJ SOLN: 4.0000 mg | Freq: Once | INTRAMUSCULAR | Status: DC | PRN

## 2017-11-16 MED ORDER — ACETAMINOPHEN 10 MG/ML IV SOLN
INTRAVENOUS | Status: DC | PRN
  Administered 2017-11-16: 1000 mg via INTRAVENOUS

## 2017-11-16 MED ORDER — PROPOFOL 10 MG/ML IV BOLUS
INTRAVENOUS | Status: AC
Start: 2017-11-16 — End: ?
  Filled 2017-11-16: qty 20

## 2017-11-16 MED ORDER — FAMOTIDINE 20 MG PO TABS
ORAL_TABLET | ORAL | Status: AC
  Administered 2017-11-16: 20 mg via ORAL
  Filled 2017-11-16: qty 1

## 2017-11-16 MED ORDER — BUPIVACAINE-EPINEPHRINE (PF) 0.5% -1:200000 IJ SOLN
INTRAMUSCULAR | Status: AC
  Filled 2017-11-16: qty 30

## 2017-11-16 MED ORDER — ROCURONIUM BROMIDE 50 MG/5ML IV SOLN
INTRAVENOUS | Status: AC
  Filled 2017-11-16: qty 1

## 2017-11-16 MED ORDER — KETOROLAC TROMETHAMINE 30 MG/ML IJ SOLN
INTRAMUSCULAR | Status: AC
  Filled 2017-11-16: qty 1

## 2017-11-16 MED ORDER — ACETAMINOPHEN 10 MG/ML IV SOLN
INTRAVENOUS | Status: AC
  Filled 2017-11-16: qty 100

## 2017-11-16 MED ORDER — LIDOCAINE HCL (CARDIAC) 20 MG/ML IV SOLN
INTRAVENOUS | Status: DC | PRN
  Administered 2017-11-16: 60 mg via INTRAVENOUS

## 2017-11-16 MED ORDER — ROCURONIUM BROMIDE 100 MG/10ML IV SOLN
INTRAVENOUS | Status: DC | PRN
  Administered 2017-11-16: 40 mg via INTRAVENOUS

## 2017-11-16 MED ORDER — HYDROMORPHONE HCL 1 MG/ML IJ SOLN
INTRAMUSCULAR | Status: DC | PRN
  Administered 2017-11-16 (×2): 1 mg via INTRAVENOUS

## 2017-11-16 MED ORDER — ONDANSETRON HCL 4 MG/2ML IJ SOLN
INTRAMUSCULAR | Status: DC | PRN
  Administered 2017-11-16: 4 mg via INTRAVENOUS

## 2017-11-16 MED ORDER — GLYCOPYRROLATE 0.2 MG/ML IJ SOLN
INTRAMUSCULAR | Status: DC | PRN
  Administered 2017-11-16: 0.1 mg via INTRAVENOUS

## 2017-11-16 MED ORDER — BUPIVACAINE HCL (PF) 0.25 % IJ SOLN
INTRAMUSCULAR | Status: DC | PRN
  Administered 2017-11-16: 30 mL

## 2017-11-16 MED ORDER — ONDANSETRON HCL 4 MG/2ML IJ SOLN
INTRAMUSCULAR | Status: AC
  Filled 2017-11-16: qty 2

## 2017-11-16 MED ORDER — OXYCODONE HCL 5 MG PO TABS: 5.0000 mg | ORAL_TABLET | ORAL | 0 refills | Status: DC | PRN

## 2017-11-16 MED ORDER — FAMOTIDINE 20 MG PO TABS
20.0000 mg | ORAL_TABLET | Freq: Once | ORAL | Status: AC
  Administered 2017-11-16: 20 mg via ORAL

## 2017-11-16 MED ORDER — MIDAZOLAM HCL 2 MG/2ML IJ SOLN
INTRAMUSCULAR | Status: AC
  Filled 2017-11-16: qty 2

## 2017-11-16 MED ORDER — FENTANYL CITRATE (PF) 100 MCG/2ML IJ SOLN: 25.0000 ug | INTRAMUSCULAR | Status: DC | PRN

## 2017-11-16 MED ORDER — BUPIVACAINE HCL (PF) 0.25 % IJ SOLN
INTRAMUSCULAR | Status: AC
  Filled 2017-11-16: qty 30

## 2017-11-16 SURGICAL SUPPLY — 39 items
APPLICATOR COTTON TIP 6IN STRL (MISCELLANEOUS) ×3 IMPLANT
CANISTER SUCT 1200ML W/VALVE (MISCELLANEOUS) ×3 IMPLANT
CHLORAPREP W/TINT 26ML (MISCELLANEOUS) ×3 IMPLANT
DEFOGGER SCOPE WARMER CLEARIFY (MISCELLANEOUS) ×3 IMPLANT
DERMABOND ADVANCED (GAUZE/BANDAGES/DRESSINGS) ×2
DERMABOND ADVANCED .7 DNX12 (GAUZE/BANDAGES/DRESSINGS) ×1 IMPLANT
DEVICE SECURE STRAP 25 ABSORB (INSTRUMENTS) ×3 IMPLANT
DISSECT BALLN SPACEMKR OVL PDB (BALLOONS) ×3
DISSECTOR BALLN SPCMKR OVL PDB (BALLOONS) ×1 IMPLANT
DISSECTOR KITTNER STICK (MISCELLANEOUS) ×2 IMPLANT
DISSECTORS/KITTNER STICK (MISCELLANEOUS) ×6
DRAPE INCISE IOBAN 66X45 STRL (DRAPES) ×3 IMPLANT
ELECT REM PT RETURN 9FT ADLT (ELECTROSURGICAL) ×3
ELECTRODE REM PT RTRN 9FT ADLT (ELECTROSURGICAL) ×1 IMPLANT
ENDOLOOP SUT PDS II  0 18 (SUTURE)
ENDOLOOP SUT PDS II 0 18 (SUTURE) IMPLANT
GLOVE BIO SURGEON STRL SZ7 (GLOVE) ×3 IMPLANT
GOWN STRL REUS W/ TWL LRG LVL3 (GOWN DISPOSABLE) ×2 IMPLANT
GOWN STRL REUS W/TWL LRG LVL3 (GOWN DISPOSABLE) ×4
IRRIGATION STRYKERFLOW (MISCELLANEOUS) ×1 IMPLANT
IRRIGATOR STRYKERFLOW (MISCELLANEOUS) ×3
IV NS 1000ML (IV SOLUTION) ×2
IV NS 1000ML BAXH (IV SOLUTION) ×1 IMPLANT
L-HOOK LAP DISP 36CM (ELECTROSURGICAL)
LHOOK LAP DISP 36CM (ELECTROSURGICAL) IMPLANT
MESH 3DMAX 4X6 LT LRG (Mesh General) ×3 IMPLANT
NEEDLE HYPO 22GX1.5 SAFETY (NEEDLE) ×3 IMPLANT
NS IRRIG 500ML POUR BTL (IV SOLUTION) ×3 IMPLANT
PACK LAP CHOLECYSTECTOMY (MISCELLANEOUS) ×3 IMPLANT
PENCIL ELECTRO HAND CTR (MISCELLANEOUS) ×3 IMPLANT
SCISSORS METZENBAUM CVD 33 (INSTRUMENTS) ×3 IMPLANT
SPONGE LAP 18X18 5 PK (GAUZE/BANDAGES/DRESSINGS) ×3 IMPLANT
SURGILUBE 2OZ TUBE FLIPTOP (MISCELLANEOUS) ×3 IMPLANT
SUT MNCRL AB 4-0 PS2 18 (SUTURE) ×3 IMPLANT
SUT VICRYL 0 AB UR-6 (SUTURE) ×3 IMPLANT
TRAY FOLEY CATH SILVER 16FR LF (SET/KITS/TRAYS/PACK) IMPLANT
TROCAR 5MM SINGLE VERSAONE (TROCAR) ×6 IMPLANT
TROCAR BALLN 10M OMST10SB SPAC (TROCAR) ×3 IMPLANT
TUBING INSUFFLATION (TUBING) ×3 IMPLANT

## 2017-11-16 NOTE — Anesthesia Procedure Notes (Signed)
Procedure Name: Intubation Date/Time: 11/16/2017 9:24 AM Performed by: Sherol DadeMacMang, Eiliyah Reh H, CRNA Pre-anesthesia Checklist: Patient identified, Emergency Drugs available, Suction available, Patient being monitored and Timeout performed Patient Re-evaluated:Patient Re-evaluated prior to induction Oxygen Delivery Method: Circle system utilized Preoxygenation: Pre-oxygenation with 100% oxygen Induction Type: IV induction and Cricoid Pressure applied Ventilation: Mask ventilation without difficulty Laryngoscope Size: McGraph and 3 Grade View: Grade I Tube type: Oral Tube size: 7.5 mm Number of attempts: 3 Airway Equipment and Method: Stylet and Video-laryngoscopy Placement Confirmation: ETT inserted through vocal cords under direct vision,  positive ETCO2,  CO2 detector and breath sounds checked- equal and bilateral Secured at: 22 cm Tube secured with: Tape Dental Injury: Teeth and Oropharynx as per pre-operative assessment  Difficulty Due To: Difficulty was anticipated and Difficult Airway- due to anterior larynx

## 2017-11-16 NOTE — H&P (Signed)
Corey Boyd Kimrey is an 52 y.o. male.       Chief Complaint  Patient presents with  . New Patient (Initial Visit)    Left Inguinal Hernia    HPI: A 52 year old patient with a prior history of a right inguinal hernia repair by Dr. Katrinka BlazingSmith several years ago. Now comes with any onset of a left inguinal hernia. She reports intermittent mild pain, sharp in nature. No specific alleviating or aggravating factors.. No evidence of strangulation or incarceration. No evidence of bowel obstruction. Patient had a history of depression and suicidal ideation a few months ago, pt has improved from this. He smokes daily and admits to social drinking. He Is able to perform more than 4 Mets of activity without any shortness of breath or chest pain. I have reviewed his labs and he had a normal LFTs, CBC and creatinine. No evidence of cirrhosis or liver damage.  History reviewed. No pertinent past medical history.       Past Surgical History:  Procedure Laterality Date  . HERNIA REPAIR    . INGUINAL HERNIA REPAIR Right 2005  . LEG SURGERY    . VASECTOMY    . VASECTOMY  2000         Family History  Problem Relation Age of Onset  . Heart disease Father     Social History:  reports that he has been smoking.  He has been smoking about 1.00 pack per day. he has never used smokeless tobacco. He reports that he drinks alcohol. He reports that he does not use drugs.  Allergies: No Known Allergies  Medications reviewed.    ROS Full ROS performed and is otherwise negative other than what is stated in the HPI     Physical Exam  Constitutional: He is oriented to person, place, and time and well-developed, well-nourished, and in no distress. No distress.  HENT:  Head: Normocephalic and atraumatic.  Eyes: Right eye exhibits no discharge. Left eye exhibits no discharge. No scleral icterus.  Neck: Normal range of motion. No JVD present. No tracheal deviation present. No  thyromegaly present.  Cardiovascular: Normal rate, regular rhythm and normal heart sounds.  Pulmonary/Chest: Effort normal. No stridor. No respiratory distress. He has no wheezes. He has no rales. He exhibits no tenderness.  Abdominal: Soft. He exhibits no distension and no mass. There is no tenderness. There is no rebound and no guarding.  Reducible Left IH, Previous RIH scar.  Genitourinary: Prostate normal and penis normal.  Neurological: He is oriented to person, place, and time. Gait normal. GCS score is 15.  Skin: Skin is warm and dry. He is not diaphoretic.  Psychiatric: Mood, memory, affect and judgment normal.  Nursing note and vitals reviewed.    Assessment/Plan: Patient with a symptomatic left inguinal hernia repair. Open versus laparoscopic were explained. He is a good candidate for laparoscopic surgery and is interested in a minimally invasive approach. I have explained the procedure, risks, and aftercare of inguinal hernia repair to Pritesh Deforest Boyd Stanard.   Risks include but are not limited to bleeding, infection, wound problems, anesthesia, recurrence, bladder or intestine injury, urinary retention, testicular dysfunction, chronic pain, mesh problems.  He  seems to understand and agrees to proceed.  Questions were answered to his stated satisfaction. Plan for Lap Left IH repair w mesh.  Sterling Bigiego Pabon, MD [facility] General Surgeon

## 2017-11-16 NOTE — Anesthesia Postprocedure Evaluation (Signed)
Anesthesia Post Note  Patient: Corey Boyd  Procedure(s) Performed: LAPAROSCOPIC INGUINAL HERNIA (Left Abdomen)  Patient location during evaluation: PACU Anesthesia Type: General Level of consciousness: awake and alert Pain management: pain level controlled Vital Signs Assessment: post-procedure vital signs reviewed and stable Respiratory status: spontaneous breathing, nonlabored ventilation, respiratory function stable and patient connected to nasal cannula oxygen Cardiovascular status: blood pressure returned to baseline and stable Postop Assessment: no apparent nausea or vomiting Anesthetic complications: no     Last Vitals:  Vitals:   11/16/17 1206 11/16/17 1212  BP: 124/79 129/82  Pulse: 65 66  Resp: 12 14  Temp:  36.6 C  SpO2: 94% 96%    Last Pain:  Vitals:   11/16/17 1206  TempSrc:   PainSc: 0-No pain                 Tuyen Uncapher S

## 2017-11-16 NOTE — Anesthesia Preprocedure Evaluation (Signed)
Anesthesia Evaluation  Patient identified by MRN, date of birth, ID band Patient awake    Reviewed: Allergy & Precautions, NPO status , Patient's Chart, lab work & pertinent test results, reviewed documented beta blocker date and time   Airway Mallampati: II  TM Distance: >3 FB     Dental  (+) Chipped   Pulmonary Current Smoker,           Cardiovascular      Neuro/Psych PSYCHIATRIC DISORDERS Depression    GI/Hepatic   Endo/Other    Renal/GU      Musculoskeletal   Abdominal   Peds  Hematology   Anesthesia Other Findings ETOH.  Reproductive/Obstetrics                             Anesthesia Physical Anesthesia Plan  ASA: III  Anesthesia Plan: General   Post-op Pain Management:    Induction: Intravenous  PONV Risk Score and Plan:   Airway Management Planned: Oral ETT  Additional Equipment:   Intra-op Plan:   Post-operative Plan:   Informed Consent: I have reviewed the patients History and Physical, chart, labs and discussed the procedure including the risks, benefits and alternatives for the proposed anesthesia with the patient or authorized representative who has indicated his/her understanding and acceptance.     Plan Discussed with: CRNA  Anesthesia Plan Comments:         Anesthesia Quick Evaluation

## 2017-11-16 NOTE — Op Note (Signed)
Laparoscopic left Inguinal Hernia Repair  Corey Boyd GauzeFoster  11/16/2017  Pre-operative Diagnosis: left Inguinal Hernia  Post-operative Diagnosis: Same  Procedure: Laparoscopic preperitoneal repair of  Left inguinal hernia w large 3 D Bard mesh  Surgeon: Sterling Bigiego Pabon, MD FACS  Anesthesia: Gen. with endotracheal tube  Findings: Pataloon hernia w direct and indirect component. Preperitoneal space w previous scaring from previous hernia repair.  Procedure Details  The patient was seen again in the Holding Room. The benefits, complications, treatment options, and expected outcomes were discussed with the patient. The risks of bleeding, infection, recurrence of symptoms, failure to resolve symptoms, recurrence of hernia, ischemic orchitis, chronic pain syndrome or neuroma, were discussed again. The likelihood of improving the patient's symptoms with return to their baseline status is good.  The patient and/or family concurred with the proposed plan, giving informed consent.  The patient was taken to Operating Room, identified as Corey Boyd and the procedure verified as Laparoscopic Inguinal Hernia Repair. Laterality confirmed.  A Time Out was held and the above information confirmed.  Prior to the induction of general anesthesia, antibiotic prophylaxis was administered. VTE prophylaxis was in place. General endotracheal anesthesia was then administered and tolerated well. After the induction, the abdomen was prepped with Chloraprep and draped in the sterile fashion. The patient was positioned in the supine position.  Local anesthetic  was injected into the skin near the umbilicus and an incision made. An incision was made and dissection down to the rectus fascia was performed. The fascia was incised and the muscle retracted laterally. The Covidien dissecting balloon was placed followed by the structural balloon. The preperitoneal space was insufflated and under direct vision 2 midline 5 mm ports  were placed.  Dissection was performed to delineate Cooper's ligament and the lateral extent of dissection was determined. The preperitoneal space needed more dissection than usual due to the previous repair, we took a bit longer than usual to develop the space but once adequate dissection was performed we had a nice visualization of the anatomy.   The nerve on the lateral abdominal wall was identified and kept in view at all times. The cord was skeletonized of the indirect sac and cord lipoma which was retracted cephalad . We visualized a direct hernia defect as well.  Once this was complete, a 3 D large mesh was placed into the preperitoneal space and it was held in place with the absorbable tacking device avoiding the area of the nerve. Once assuring that the hernias were completely repaired and adequately covered, the preperitoneal space was desufflated under direct vision. There was no sign of peritoneal rent and no sign of bowel intrusion towards the mesh.  Once assuring that hemostasis was adequate the ports were removed and a figure-of-eight 0 Vicryl suture was placed at the fascial edges. 4-0 subcuticular Monocryl was used at all skin edges. Steri-Strips and Mastisol and sterile dressings were placed.  Patient tolerated the procedure well. There were no complications. He was taken to the recovery room in stable condition.               Sterling Bigiego Pabon, MD, FACS

## 2017-11-16 NOTE — OR Nursing (Signed)
Discussed discharge instructions with pt and family. Both voiced understanding. 

## 2017-11-16 NOTE — Anesthesia Post-op Follow-up Note (Signed)
Anesthesia QCDR form completed.        

## 2017-11-16 NOTE — Discharge Instructions (Signed)

## 2017-11-16 NOTE — Transfer of Care (Signed)
Immediate Anesthesia Transfer of Care Note  Patient: Corey Boyd  Procedure(s) Performed: LAPAROSCOPIC INGUINAL HERNIA (Left Abdomen)  Patient Location: PACU  Anesthesia Type:General  Level of Consciousness: drowsy and patient cooperative  Airway & Oxygen Therapy: Patient Spontanous Breathing and Patient connected to face mask oxygen  Post-op Assessment: Report given to RN, Post -op Vital signs reviewed and stable and Patient moving all extremities X 4  Post vital signs: Reviewed and stable  Last Vitals:  Vitals:   11/16/17 0819  BP: 137/76  Pulse: 72  Resp: 17  Temp: 36.7 C  SpO2: 100%    Last Pain:  Vitals:   11/16/17 0819  TempSrc: Tympanic         Complications: No apparent anesthesia complications

## 2017-11-17 ENCOUNTER — Telehealth: Payer: Self-pay | Admitting: Surgery

## 2017-11-17 NOTE — Telephone Encounter (Signed)
Call returned Terri at this time referring to Corey Boyd. I asked her if he was still having pain. She stated that he is not at the moment due to taking his pain medication and that he is concerned about the bulge. I verbalized to her that it maybe retained fluid that his body will natural absorb and that this is normal after having this type of surgery. I also advised that he try placing and ice pack with a stable book underneath to help with the blood flow of gravity. I advise that he try some over the counter Miralax as well to help with the constipation that he is currently having and to increase his water intake when doing so. I verbalized that the sharp pain that he felt last night could be the way the mesh is placed on a possible nerve. I told her that if none of anything I advise helps to give our office a call to be seen sooner. I also verbalized that if they feel the need to be seen on the weekend due to our office not being open to ask the ED nurse to call the on call doctor. They verbalized understanding.

## 2017-11-17 NOTE — Telephone Encounter (Signed)
Patient's fiance has called--Terri--with concerns. Patient had surgery on 11/16/17-Dr Pabon-Laparoscopic preperitoneal repair of left inguinal hernia with Large Mesh.   Camelia Engerri states that patient felt a sharp pain going down his left side last night. When he lays down he has no bulge, however when he sits up or stands there is a bulge. Patient is in moderate pain that she states is normal after surgery and is taking his pain medication as instructed. Camelia Engerri also states that patient has not had a bowel movement since surgery, however surgery was only 24 hours ago and she did not know if this should be a concern as well.

## 2017-11-22 ENCOUNTER — Ambulatory Visit (INDEPENDENT_AMBULATORY_CARE_PROVIDER_SITE_OTHER): Payer: Self-pay | Admitting: Surgery

## 2017-11-22 ENCOUNTER — Encounter: Payer: Self-pay | Admitting: Surgery

## 2017-11-22 VITALS — BP 150/85 | HR 87 | Temp 98.7°F | Wt 202.0 lb

## 2017-11-22 DIAGNOSIS — Z9889 Other specified postprocedural states: Secondary | ICD-10-CM

## 2017-11-22 DIAGNOSIS — Z8719 Personal history of other diseases of the digestive system: Secondary | ICD-10-CM

## 2017-11-22 MED ORDER — OXYCODONE HCL 5 MG PO CAPS: 5.0000 mg | ORAL_CAPSULE | ORAL | 0 refills | Status: DC | PRN

## 2017-11-22 NOTE — Progress Notes (Signed)
Surgical Clinic Progress/Follow-up Note   HPI:  52 y.o. Male presents to clinic for early post-op follow-up evaluation, 6 Days s/p laparoscopic repair of a symptomatic (painful) Left inguinal hernia with mesh. Patient reports concern that painful hard Left groin swelling that he began experiencing the day after his surgery might represent recurrence of his hernia. He says the pain is constant, but tolerable, and has been somewhat controlled with Tylenol and Motrin since he ran out of his prescribed narcotic pain medication. He also describes that his Left groin swelling was only noticeable when he stood over the first few days after surgery, but has since become persistent and hard regardless of his position. He otherwise reports +flatus and normalization of +BM's since taking Miralax last week as advised, denies abdominal bloating/distention, N/V, fever/chills, CP, or SOB.  Review of Systems:  Constitutional: denies any other weight loss, fever, chills, or sweats  Eyes: denies any other vision changes, history of eye injury  ENT: denies sore throat, hearing problems  Respiratory: denies shortness of breath, wheezing  Cardiovascular: denies chest pain, palpitations  Gastrointestinal: abdominal and Left groin pain, N/V, and bowel function as per HPI Musculoskeletal: denies any other joint pains or cramps  Skin: Denies any other rashes or skin discolorations  Neurological: denies any other headache, dizziness, weakness  Psychiatric: denies any other depression, anxiety  All other review of systems: otherwise negative   Vital Signs:  BP (!) 150/85   Pulse 87   Temp 98.7 F (37.1 C) (Oral)   Wt 202 lb (91.6 kg)   BMI 28.17 kg/m    Physical Exam:  Constitutional:  -- Normal body habitus  -- Awake, alert, and oriented x3  Eyes:  -- Pupils equally round and reactive to light  -- No scleral icterus  Ear, nose, throat:  -- No jugular venous distension  -- No nasal drainage,  bleeding Pulmonary:  -- No crackles -- Equal breath sounds bilaterally -- Breathing non-labored at rest Cardiovascular:  -- S1, S2 present  -- No pericardial rubs  Gastrointestinal:  -- Soft, nontender, non-distended, no guarding/rebound  -- No abdominal masses appreciated, pulsatile or otherwise  Musculoskeletal / Integumentary:  -- Wounds or skin discoloration: Left groin firm and tender swelling not at all affected (except pain) with coughing or sitting up from reclined position, moderate lower abdominal and Left groin ecchymosis without any surrounding erythema, laparoscopic port sites well-approximated without erythema or drainage -- Extremities: B/L UE and LE FROM, hands and feet warm, no edema  Neurologic:  -- Motor function: intact and symmetric  -- Sensation: intact and symmetric   Assessment:  52 y.o. yo Male with a problem list including...  Patient Active Problem List   Diagnosis Date Noted  . Non-recurrent unilateral inguinal hernia without obstruction or gangrene   . Tobacco use disorder 12/19/2016  . Alcohol use disorder, moderate, dependence (HCC)   . 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  Plan:   - continue warm compresses +/- ice prn   - continue to avoid heavy lifting >15 - 20 lbs or strenuous activity  - maintain hydration and high fiber diet to minimize constipation + Miralax prn for constipation  - Tylenol, Motrin as  per package instructions, oxycodone extended x 15 tablets  - return to clinic in 1 week, instructed to call office if questions or concerns  All of the above recommendations were discussed with the patient,  and all of patient's questions were answered to his expressed satisfaction.  -- Scherrie Gerlach Earlene Plater, MD, RPVI [place]: [facility] General [facility] - Partnering for exceptional care. Office: [phone]

## 2017-11-22 NOTE — Patient Instructions (Signed)
Please give us a call in case you have any questions or concerns.  

## 2017-11-23 ENCOUNTER — Ambulatory Visit: Payer: Self-pay | Admitting: Surgery

## 2017-11-30 ENCOUNTER — Encounter: Payer: Self-pay | Admitting: Surgery

## 2017-11-30 ENCOUNTER — Ambulatory Visit (INDEPENDENT_AMBULATORY_CARE_PROVIDER_SITE_OTHER): Payer: Self-pay | Admitting: Surgery

## 2017-11-30 VITALS — BP 158/84 | HR 74 | Temp 98.0°F | Ht 71.0 in | Wt 203.0 lb

## 2017-11-30 DIAGNOSIS — Z09 Encounter for follow-up examination after completed treatment for conditions other than malignant neoplasm: Secondary | ICD-10-CM

## 2017-11-30 NOTE — Patient Instructions (Signed)

## 2017-11-30 NOTE — Progress Notes (Signed)
S/p Lap RiH Developed a hematoma but is improving Minimal pain  PE NAD Abd: soft, incisions c/d/i. Resolving hematoma .   A/p improving Ice pack and NSAIds,  No complications HE will call us if there are any issues

## 2017-12-07 ENCOUNTER — Encounter: Payer: Self-pay | Admitting: Surgery
